# Patient Record
Sex: Female | Born: 1975 | Race: White | Hispanic: No | Marital: Married | Smoking: Never smoker
Health system: Southern US, Community
[De-identification: ages and names within clinical notes are randomized; demographics above are authoritative.]

## PROBLEM LIST (undated history)

## (undated) DIAGNOSIS — R112 Nausea with vomiting, unspecified: Secondary | ICD-10-CM

## (undated) DIAGNOSIS — Z9889 Other specified postprocedural states: Secondary | ICD-10-CM

## (undated) DIAGNOSIS — K589 Irritable bowel syndrome without diarrhea: Secondary | ICD-10-CM

## (undated) DIAGNOSIS — K219 Gastro-esophageal reflux disease without esophagitis: Secondary | ICD-10-CM

## (undated) HISTORY — DX: Irritable bowel syndrome, unspecified: K58.9

---

## 2006-06-16 ENCOUNTER — Ambulatory Visit: Payer: Self-pay | Admitting: Family Medicine

## 2006-06-25 ENCOUNTER — Encounter: Admission: RE | Admit: 2006-06-25 | Discharge: 2006-06-25 | Payer: Self-pay | Admitting: Family Medicine

## 2006-07-07 ENCOUNTER — Ambulatory Visit: Payer: Self-pay | Admitting: Family Medicine

## 2006-07-07 ENCOUNTER — Ambulatory Visit: Payer: Self-pay | Admitting: *Deleted

## 2006-07-20 HISTORY — PX: WISDOM TOOTH EXTRACTION: SHX21

## 2007-11-20 ENCOUNTER — Encounter (INDEPENDENT_AMBULATORY_CARE_PROVIDER_SITE_OTHER): Payer: Self-pay | Admitting: Obstetrics and Gynecology

## 2007-11-20 ENCOUNTER — Inpatient Hospital Stay (HOSPITAL_COMMUNITY): Admission: AD | Admit: 2007-11-20 | Discharge: 2007-11-25 | Payer: Self-pay | Admitting: Obstetrics and Gynecology

## 2007-12-14 ENCOUNTER — Ambulatory Visit: Admission: RE | Admit: 2007-12-14 | Discharge: 2007-12-14 | Payer: Self-pay | Admitting: Obstetrics and Gynecology

## 2008-03-24 ENCOUNTER — Encounter: Payer: Self-pay | Admitting: Family Medicine

## 2008-04-11 ENCOUNTER — Encounter: Payer: Self-pay | Admitting: Family Medicine

## 2008-04-17 ENCOUNTER — Telehealth (INDEPENDENT_AMBULATORY_CARE_PROVIDER_SITE_OTHER): Payer: Self-pay | Admitting: *Deleted

## 2008-04-17 ENCOUNTER — Encounter: Payer: Self-pay | Admitting: Family Medicine

## 2008-04-17 DIAGNOSIS — O1410 Severe pre-eclampsia, unspecified trimester: Secondary | ICD-10-CM | POA: Insufficient documentation

## 2008-04-17 LAB — CONVERTED CEMR LAB
AST: 26 units/L (ref 0–37)
BUN: 7 mg/dL (ref 6–23)
Basophils Relative: 0.5 % (ref 0.0–1.0)
Creatinine, Ser: 0.9 mg/dL (ref 0.4–1.2)
Eosinophil percent: 1.8 % (ref 0.0–5.0)
GFR calc non Af Amer: 80 mL/min
Glomerular Filtration Rate, Af Am: 97 mL/min/{1.73_m2}
H Pylori IgG: NEGATIVE
MCHC: 33.6 g/dL (ref 30.0–36.0)
MCV: 97 fL (ref 78.0–100.0)
Monocytes Absolute: 0.3 10*3/uL (ref 0.2–0.7)
Neutro Abs: 2.8 10*3/uL (ref 1.4–7.7)
Potassium: 4 meq/L (ref 3.5–5.1)
RDW: 11.4 % — ABNORMAL LOW (ref 11.5–14.6)
Sodium: 140 meq/L (ref 135–145)
T3, Free: 3.3 pg/mL (ref 2.3–4.2)

## 2009-03-14 ENCOUNTER — Ambulatory Visit: Payer: Self-pay | Admitting: Family Medicine

## 2009-03-14 DIAGNOSIS — M79609 Pain in unspecified limb: Secondary | ICD-10-CM

## 2009-03-14 DIAGNOSIS — F329 Major depressive disorder, single episode, unspecified: Secondary | ICD-10-CM

## 2009-05-15 ENCOUNTER — Ambulatory Visit: Payer: Self-pay | Admitting: Family Medicine

## 2010-12-02 NOTE — Op Note (Signed)
[name], [name]            ACCOUNT NO.:  [id number]   MEDICAL RECORD NO.:  [id number]          PATIENT TYPE:  INP   LOCATION:  9103                          FACILITY:  [facility]   PHYSICIAN:  Guy Sandifer. Henderson Cloud, M.D. DATE OF BIRTH:  1976-04-22   DATE OF PROCEDURE:  11/20/2007  DATE OF DISCHARGE:                               OPERATIVE REPORT   PREOPERATIVE DIAGNOSES:  1. Intrauterine pregnancy at 40-1/7th weeks' estimated gestational      age.  2. Nonreassuring fetal heart tracing.  3. Possible placental abruption.   POSTOPERATIVE DIAGNOSES:  1. Intrauterine pregnancy at 40-1/7th weeks' estimated gestational      age.  2. Nonreassuring fetal heart tracing.  3. Possible placental abruption.   PROCEDURE:  Low-transverse cesarean section.   SURGEON:  Guy Sandifer. Henderson Cloud, MD   ANESTHESIA:  Epidural by Burnett Corrente, MD   SPECIMENS:  Placenta to pathology.   ESTIMATED BLOOD LOSS:  800 mL.   FINDINGS:  Viable female infant, Apgars of 8 at nine and 1 at five  minutes respectively.  Birth weight pending.  Arterial cord pH 7.17.   INDICATIONS AND CONSENT:  This patient is a 35 year old married white  female G1, P0 with an EDC of Nov 19, 2007.  She presents with uterine  contractions.  Cervix is 2 cm dilated, 90% effaced, -1 to -2 station  with regular firm contractions.  Fetal heart rate is unreactive.  Artificial rupture of membranes with clear fluid is carried out.  She  subsequently has an epidural placed.  An intrauterine pressure catheter  was placed and subsequent to that Pitocin was started.  She had adequate  labor.  Cervix progresses to 4-5 cm.  Vertex is now -3 with caput  formation and a puffy cervix.  There is a moderate amount of bleeding  that appears too heavy to be consistent with bloody show and could  possibly represent placental abruption.  Uterus is soft between  contractions.  In addition, there are decelerations to the 70s with each  contraction, which are  getting deeper.  Due to relative lack of progress  with good labor, an anticipated length of labor with decelerations, and  now the bleeding, recommendation for cesarean section was made.  Potential risks and complications were discussed including, but not  limited to infection, organ damage, bleeding requiring transfusion of  blood products with possible HIV and hepatitis acquisition, DVT, PE, and  pneumonia.  All questions were answered and consent is signed on the  chart.   PROCEDURE:  The patient was taken to operating room where epidural was  augmented to a surgical level.  She is prepped and draped in a sterile  fashion.  Foley catheter is already in place.  After testing for  adequate epidural anesthesia, skin is entered through a Pfannenstiel  incision and dissection was carried out at the layers of the peritoneum.  Peritoneum was incised, extended superiorly and inferiorly.  Vesicouterine peritoneum was taken down cephalad laterally.  Bladder  flap was developed and bladder blade is placed.  Uterus was incised in a  low transverse manner and the  uterine cavity is entered bluntly with a  hemostat.  The uterine incision is extended cephalad laterally with  fingers.  Baby is noted to be a LOT.  Vertex is then delivered.  There  is a nuchal cord x1 that has reduced.  Oronasal pharynx were suctioned.  Remainder of the baby was delivered.  Good cry and tone is noted.  Cord  is clamped and cut.  The baby was handed to the awaiting pediatrics  team.  Placenta was manually delivered.  Uterine cavity is clean.  Uterus is closed in two running locking imbricating layers with 0  Monocryl suture, which achieves good hemostasis.  Tubes and ovaries were  normal bilaterally.  Anterior peritoneum was closed in running fashion  with 0 Monocryl suture which was also used to reapproximate the  pyramidalis muscle in the midline.  Anterior rectus fascia was closed in  running fashion with 0 PDS  suture and the skin was closed with clips.  All sponge, instrument, and needle counts were correct and the patient  was transferred to recovery room in stable condition.      Guy Sandifer Henderson Cloud, M.D.  Electronically Signed     JET/MEDQ  D:  11/20/2007  T:  11/21/2007  Job:  [id number]

## 2010-12-02 NOTE — Discharge Summary (Signed)
[name], [name]            ACCOUNT NO.:  [id number]   MEDICAL RECORD NO.:  [id number]          PATIENT TYPE:  INP   LOCATION:  9371                          FACILITY:  [facility]   PHYSICIAN:  Michelle L. Grewal, M.D.DATE OF BIRTH:  [birth year]   DATE OF ADMISSION:  11/20/2007  DATE OF DISCHARGE:  11/25/2007                               DISCHARGE SUMMARY   ADMITTING DIAGNOSIS:  1. Intrauterine pregnancy at 103 and 7 weeks estimated gestational age.  2. Spontaneous onset of labor.   DISCHARGE DIAGNOSIS:  1. Status post low transverse cesarean section secondary to      nonreassuring fetal heart tones with positive placental abruption.  2. HELLP syndrome.  3. Viable female infant.   PROCEDURE:  Primary low transverse cesarean section.   REASON FOR ADMISSION:  Please see written H&P.   HOSPITAL COURSE:  The patient is a 31-year white married female  primigravida who was admitted to [facility] with  spontaneous onset of labor.  On admission, vital signs were stable.  Cervix was dilated to 2 cm, 90% effaced, vertex at -1 to -2 station.  Fetal heart tones were reactive.  Artificial rupture of membranes was  performed which revealed clear fluid.  The patient did have epidural  placed for her comfort.  Intrauterine pressure catheter was placed.  Subsequent to that, Pitocin was started.  The patient did progress to 4-  5 cm vertex with now in a -3 station with caput formation and a puffy  cervix.  There was a moderate amount of bleeding that was noted too  heavy to be consistent with bloody show and possible could be placental  abruption.  Uterus was soft, however, between contractions.  The fetal  heart tones began to be somewhat nonreactive with deceleration into the  70s with the contractions and due to the relative lack of progress with  anticipated length of labor, vaginal bleeding, decision was made to  recommend cesarean delivery.  The patient was then taken to  the  operating room where epidural was dosed to an adequate surgical level.  A low transverse incision was made with delivery of a viable female  infant weighing 7 pounds 5 ounces with Apgars of 8 at 1 minute and 8 at  5 minutes.  Arterial cord pH was 7.17.  The patient tolerated the  procedure well and was taken to the recovery room in stable condition.  On the following morning, the patient was without complaint other than  itching.  Vital signs were stable.  She was afebrile.  Blood pressure  was 100/64 to 195/57, and pulse was 62-71.  Abdomen was soft, good  return of bowel function.  Fundus was firm and nontender.  Abdominal  dressing was noted to be clean, dry, and intact.  Foley had been  discontinued and the patient was voiding well.  Laboratory findings  showed hemoglobin of 9.5, platelet count of 37,000, WBC count of 13.8,  platelets had been 115 on admission and that was noted to be 176 on her  prenatal lab work.  Repeat CBC was ordered at noon, and the patient  was  discontinued on Motrin.  On the repeat CBC, platelets were 43,000.  On  the following morning, the patient denied headache, but she did have  some blurred vision and some shakes and felt week.  She denied any right  upper quadrant pain.  Vital signs were stable with blood pressure 92/57  to 108/60.  Abdomen was soft.  Fundus firm and nontender.  Some small  clotting was noted, but moderate lochia.  Abdominal dressing was intact.  Incision was clean, dry, and intact.  There was some small ecchymosis  noted inferior to the incisional site.  Laboratory findings revealed  hemoglobin of 9.2 and platelet count up to 45000, WBC count of 14.1.  PIH labs were ordered for 12 noon.  Upon the laboratory findings, PIH  labs revealed platelets were up to 60,000, but SGOT was 95 and SGPT was  184 and LDH was 363.  Hemoglobin was stable at 10.2.  Blood pressures  remained 90-100/50-60.  Deep tendon reflexes 2+.  The patient was  now  transferred to the [facility] where she was started on magnesium sulfate,  given that she was clinically asymptomatic.  On the following morning,  the patient was feeling better.  Blood pressure was 120/82.  She  remained afebrile.  Platelets were up to 80,000, hemoglobin 9.7, SGOT  was 57, and SGPT was 130.  The patient continued on the magnesium  sulfate for 24 hours.  On the following morning, vital signs were  stable.  Abdomen was soft.  Laboratory findings revealed hemoglobin of  9.8 and platelet count up to 128,000.  AST was 57 and ALT was 114.  On  the following morning, the patient stated that she was without  complaint.  She felt more like her self.  Abdomen was soft.  Incision  was clean, dry, and intact.  Laboratory findings revealed hemoglobin of  10.1 and platelet count of 172,000.  Discharge instructions were  reviewed and the patient was later discharged home.   CONDITION ON DISCHARGE:  Stable.   DIET:  Regular as tolerated.   ACTIVITY:  No heavy lifting, no driving x2 weeks, no vaginal entry.   FOLLOW UP:  The patient is to follow up in the office in 2-3 days for  laboratory evaluation and blood pressure check.  She is to call for  temperature greater than 100 degrees, persistent nausea, vomiting, or  heavy vaginal bleeding, and/or redness or drainage from incisional site.  The patient was also instructed to call for headache, blurred vision, or  right upper quadrant pain.   DISCHARGE MEDICATIONS:  1. Tylox #30 one p.o. 4-6 h. p.r.n.  2. Motrin 600 mg every 6 hours.  3. Prenatal vitamins 1 p.o. daily.  4. Colace 1 p.o. daily.      Julio Sicks, N.P.      Stann Mainland. Vincente Poli, M.D.  Electronically Signed    CC/MEDQ  D:  12/12/2007  T:  12/12/2007  Job:  [id number]

## 2011-11-07 ENCOUNTER — Emergency Department (HOSPITAL_COMMUNITY): Payer: No Typology Code available for payment source

## 2011-11-07 ENCOUNTER — Encounter (HOSPITAL_COMMUNITY): Payer: Self-pay | Admitting: *Deleted

## 2011-11-07 ENCOUNTER — Emergency Department (HOSPITAL_COMMUNITY)
Admission: EM | Admit: 2011-11-07 | Discharge: 2011-11-07 | Disposition: A | Discharge status: Home / Self Care | Payer: No Typology Code available for payment source | Attending: Emergency Medicine | Admitting: Emergency Medicine

## 2011-11-07 DIAGNOSIS — IMO0002 Reserved for concepts with insufficient information to code with codable children: Secondary | ICD-10-CM

## 2011-11-07 DIAGNOSIS — Y9229 Other specified public building as the place of occurrence of the external cause: Secondary | ICD-10-CM | POA: Insufficient documentation

## 2011-11-07 DIAGNOSIS — S91109A Unspecified open wound of unspecified toe(s) without damage to nail, initial encounter: Secondary | ICD-10-CM | POA: Insufficient documentation

## 2011-11-07 DIAGNOSIS — W268XXA Contact with other sharp object(s), not elsewhere classified, initial encounter: Secondary | ICD-10-CM | POA: Insufficient documentation

## 2011-11-07 MED ORDER — LIDOCAINE HCL 1 % IJ SOLN
5.0000 mL | Freq: Once | INTRAMUSCULAR | Status: DC
  Filled 2011-11-07: qty 20

## 2011-11-07 MED ORDER — HYDROCODONE-ACETAMINOPHEN 5-325 MG PO TABS: 1.0000 | ORAL_TABLET | ORAL | Status: AC | PRN

## 2011-11-07 NOTE — ED Notes (Signed)
Tiffany, PA at bedside for sutures.

## 2011-11-07 NOTE — ED Notes (Signed)
Lidocaine and suture cart at bedside.  

## 2011-11-07 NOTE — ED Notes (Signed)
Pt states she was shopping at [employer] 45 minutes ago, glass bottle fell on her left foot. Staff cleaned wound and wrapped foot

## 2011-11-07 NOTE — ED Provider Notes (Signed)
History     CSN: [id number]  Arrival date & time 11/07/11  1649   First MD Initiated Contact with Patient 11/07/11 1719      Chief Complaint  Patient presents with  . Laceration    (Consider location/radiation/quality/duration/timing/severity/associated sxs/prior treatment) HPI  Pt at [employer] when glass bottle fell off of shelf and on to her foot. The patient states that she received a laceration to the toe on her left foot and that it is throbbing. This happened just prior to arrival. The severity is moderation. She denies syncope, weakness or injury to any other part of her body durign this incident.   History reviewed. No pertinent past medical history.  Past Surgical History  Procedure Date  . Cesarean section 2009    History reviewed. No pertinent family history.  History  Substance Use Topics  . Smoking status: Never Smoker   . Smokeless tobacco: Not on file  . Alcohol Use: No    OB History    Grav Para Term Preterm Abortions TAB SAB Ect Mult Living                  Review of Systems   HEENT: denies blurry vision or change in hearing PULMONARY: Denies difficulty breathing and SOB CARDIAC: denies chest pain or heart palpitations MUSCULOSKELETAL: pt is ambulatory, denies being unable to ambulate ABDOMEN AL: denies abdominal pain GU: denies loss of bowel urinary control NEURO: denies numbness and tingling in extremities   Allergies  Review of patient's allergies indicates no known allergies.  Home Medications   Current Outpatient Rx  Name Route Sig Dispense Refill  . HYDROCODONE-ACETAMINOPHEN 5-325 MG PO TABS Oral Take 1 tablet by mouth every 4 (four) hours as needed for pain. 6 tablet 0    BP 111/64  Pulse 60  Temp(Src) 98.2 F (36.8 C) (Oral)  Resp 16  Ht 5\' 2"  (1.575 m)  Wt 120 lb (54.432 kg)  BMI 21.95 kg/m2  SpO2 100%  LMP 11/02/2011  Physical Exam  ED Course  Procedures (including critical care time)  Labs Reviewed - No data  to display Dg Foot Complete Left  11/07/2011  *RADIOLOGY REPORT*  Clinical Data: Glass bottle fell on foot, laceration to third toe  LEFT FOOT - COMPLETE 3+ VIEW  Comparison: None.  Findings: No fracture or dislocation is seen.  The joint spaces are preserved.  Visualized soft tissues are grossly unremarkable.  No radiopaque foreign body is seen.  IMPRESSION: No fracture, dislocation, or radiopaque foreign body is seen.  Original Report Authenticated By: Charline Bills, M.D.     1. Laceration       MDM  LACERATION REPAIR Performed by: Dorthula Matas Authorized by: Dorthula Matas Consent: Verbal consent obtained. Risks and benefits: risks, benefits and alternatives were discussed Consent given by: patient Patient identity confirmed: provided demographic data Prepped and Draped in normal sterile fashion Wound explored  Laceration Location: 4th toe of left foot  Laceration Length: 1.25 cm  No Foreign Bodies seen or palpated  Anesthesia: local infiltration  Local anesthetic: lidocaine 2% wo epinephrine  Anesthetic total: 2 ml  Irrigation method: syringe Amount of cleaning: standard  Skin closure: sutures  Number of sutures: 3  Technique: simple interrupted  Patient tolerance: Patient tolerated the procedure well with no immediate complications. \  Pt given 6 Vicodin for pain. xrays negative. No red flags noted on exam.  Pt has been advised of the symptoms that warrant their return to the ED. Patient  has voiced understanding and has agreed to follow-up with the PCP or specialist.       Dorthula Matas, PA 11/07/11 1821

## 2011-11-07 NOTE — Discharge Instructions (Signed)

## 2011-11-08 NOTE — ED Provider Notes (Signed)
Medical screening examination/treatment/procedure(s) were performed by non-physician practitioner and as supervising physician I was immediately available for consultation/collaboration.  Toy Baker, MD 11/08/11 2329

## 2011-11-14 ENCOUNTER — Ambulatory Visit (INDEPENDENT_AMBULATORY_CARE_PROVIDER_SITE_OTHER): Payer: BC Managed Care – PPO | Admitting: Family Medicine

## 2011-11-14 VITALS — BP 96/61 | HR 68 | Temp 98.2°F | Resp 16 | Ht 61.5 in | Wt 114.4 lb

## 2011-11-14 DIAGNOSIS — Z4802 Encounter for removal of sutures: Secondary | ICD-10-CM

## 2011-11-14 NOTE — Progress Notes (Signed)
  Subjective:    Patient ID: [name], female    DOB: 01-19-1976, 36 y.o.   MRN: [id number]  HPI  Patient presents for suture removal. Laceration 4/20 after glass bottle fell on (L) foot. Denies S/S of secondary infection.  Foot occasionally achy.  Unknown date of last Tdap.  Review of Systems     Objective:   Physical Exam  Constitutional: She appears well-developed.  HENT:  Head: Normocephalic.  Neurological: She is alert.  Skin:       Well healed laceration to (L) 3rd toe. (3) interrupted sutures removed without diificulty  Psychiatric: She has a normal mood and affect.          Assessment & Plan:   1. Visit for suture removal                                 Patient to see GYN to update Tdap . Anticipatory guidance

## 2012-01-25 ENCOUNTER — Other Ambulatory Visit: Payer: Self-pay

## 2012-01-25 LAB — OB RESULTS CONSOLE ABO/RH: RH Type: POSITIVE

## 2012-01-25 LAB — OB RESULTS CONSOLE HEPATITIS B SURFACE ANTIGEN: Hepatitis B Surface Ag: NEGATIVE

## 2012-01-25 LAB — OB RESULTS CONSOLE GC/CHLAMYDIA: Gonorrhea: NEGATIVE

## 2012-04-05 ENCOUNTER — Other Ambulatory Visit (HOSPITAL_COMMUNITY): Payer: Self-pay | Admitting: Obstetrics and Gynecology

## 2012-04-05 DIAGNOSIS — IMO0002 Reserved for concepts with insufficient information to code with codable children: Secondary | ICD-10-CM

## 2012-04-07 ENCOUNTER — Encounter (HOSPITAL_COMMUNITY): Payer: Self-pay

## 2012-04-07 ENCOUNTER — Ambulatory Visit (HOSPITAL_COMMUNITY)
Admission: RE | Admit: 2012-04-07 | Discharge: 2012-04-07 | Disposition: A | Discharge status: Home / Self Care | Payer: BC Managed Care – PPO | Source: Ambulatory Visit | Attending: Obstetrics and Gynecology | Admitting: Obstetrics and Gynecology

## 2012-04-07 VITALS — BP 108/61 | HR 79 | Wt 126.8 lb

## 2012-04-07 DIAGNOSIS — Z363 Encounter for antenatal screening for malformations: Secondary | ICD-10-CM | POA: Insufficient documentation

## 2012-04-07 DIAGNOSIS — Z1389 Encounter for screening for other disorder: Secondary | ICD-10-CM | POA: Insufficient documentation

## 2012-04-07 DIAGNOSIS — O358XX0 Maternal care for other (suspected) fetal abnormality and damage, not applicable or unspecified: Secondary | ICD-10-CM | POA: Insufficient documentation

## 2012-04-07 DIAGNOSIS — O09299 Supervision of pregnancy with other poor reproductive or obstetric history, unspecified trimester: Secondary | ICD-10-CM | POA: Insufficient documentation

## 2012-04-07 DIAGNOSIS — IMO0002 Reserved for concepts with insufficient information to code with codable children: Secondary | ICD-10-CM

## 2012-04-07 DIAGNOSIS — O34219 Maternal care for unspecified type scar from previous cesarean delivery: Secondary | ICD-10-CM | POA: Insufficient documentation

## 2012-05-19 ENCOUNTER — Encounter (HOSPITAL_COMMUNITY): Payer: Self-pay

## 2012-05-19 ENCOUNTER — Ambulatory Visit (HOSPITAL_COMMUNITY)
Admission: RE | Admit: 2012-05-19 | Discharge: 2012-05-19 | Disposition: A | Discharge status: Home / Self Care | Payer: BC Managed Care – PPO | Source: Ambulatory Visit | Attending: Obstetrics and Gynecology | Admitting: Obstetrics and Gynecology

## 2012-05-19 VITALS — BP 99/59 | HR 86 | Wt 138.0 lb

## 2012-05-19 DIAGNOSIS — O34219 Maternal care for unspecified type scar from previous cesarean delivery: Secondary | ICD-10-CM | POA: Insufficient documentation

## 2012-05-19 DIAGNOSIS — O4400 Placenta previa specified as without hemorrhage, unspecified trimester: Secondary | ICD-10-CM | POA: Insufficient documentation

## 2012-05-19 DIAGNOSIS — O09299 Supervision of pregnancy with other poor reproductive or obstetric history, unspecified trimester: Secondary | ICD-10-CM | POA: Insufficient documentation

## 2012-05-19 DIAGNOSIS — O358XX0 Maternal care for other (suspected) fetal abnormality and damage, not applicable or unspecified: Secondary | ICD-10-CM | POA: Insufficient documentation

## 2012-05-19 DIAGNOSIS — IMO0002 Reserved for concepts with insufficient information to code with codable children: Secondary | ICD-10-CM

## 2012-07-22 ENCOUNTER — Ambulatory Visit (HOSPITAL_COMMUNITY)
Admission: RE | Admit: 2012-07-22 | Discharge: 2012-07-22 | Disposition: A | Discharge status: Home / Self Care | Payer: BC Managed Care – PPO | Source: Ambulatory Visit | Attending: Obstetrics and Gynecology | Admitting: Obstetrics and Gynecology

## 2012-07-22 VITALS — BP 107/58 | HR 89 | Wt 147.5 lb

## 2012-07-22 DIAGNOSIS — O358XX0 Maternal care for other (suspected) fetal abnormality and damage, not applicable or unspecified: Secondary | ICD-10-CM | POA: Insufficient documentation

## 2012-07-22 DIAGNOSIS — O34219 Maternal care for unspecified type scar from previous cesarean delivery: Secondary | ICD-10-CM | POA: Insufficient documentation

## 2012-07-22 DIAGNOSIS — O09299 Supervision of pregnancy with other poor reproductive or obstetric history, unspecified trimester: Secondary | ICD-10-CM | POA: Insufficient documentation

## 2012-07-22 DIAGNOSIS — O4400 Placenta previa specified as without hemorrhage, unspecified trimester: Secondary | ICD-10-CM | POA: Insufficient documentation

## 2012-07-22 DIAGNOSIS — IMO0002 Reserved for concepts with insufficient information to code with codable children: Secondary | ICD-10-CM

## 2012-07-22 NOTE — Progress Notes (Signed)
[name]  was seen today for an ultrasound appointment.  See full report in [facility].  Impression: IUP at 33 + 5 weeks Unilateral renal agenesis All other interval fetal anatomy was seen and appeared normal   Normal amniotic fluid volume  Appropriate interval growth with EFW at the 48th %tile    Recommendations: Recommend follow up ultrasound in 3 weeks to reevaluate growth and amniotic fluid.  Alpha Gula, MD

## 2012-08-12 ENCOUNTER — Ambulatory Visit (HOSPITAL_COMMUNITY)
Admission: RE | Admit: 2012-08-12 | Discharge: 2012-08-12 | Disposition: A | Discharge status: Home / Self Care | Payer: BC Managed Care – PPO | Source: Ambulatory Visit | Attending: Obstetrics and Gynecology | Admitting: Obstetrics and Gynecology

## 2012-08-12 DIAGNOSIS — O34219 Maternal care for unspecified type scar from previous cesarean delivery: Secondary | ICD-10-CM | POA: Insufficient documentation

## 2012-08-12 DIAGNOSIS — O09299 Supervision of pregnancy with other poor reproductive or obstetric history, unspecified trimester: Secondary | ICD-10-CM | POA: Insufficient documentation

## 2012-08-12 DIAGNOSIS — O4400 Placenta previa specified as without hemorrhage, unspecified trimester: Secondary | ICD-10-CM | POA: Insufficient documentation

## 2012-08-12 DIAGNOSIS — O358XX0 Maternal care for other (suspected) fetal abnormality and damage, not applicable or unspecified: Secondary | ICD-10-CM | POA: Insufficient documentation

## 2012-08-12 DIAGNOSIS — IMO0002 Reserved for concepts with insufficient information to code with codable children: Secondary | ICD-10-CM

## 2012-08-17 ENCOUNTER — Encounter (HOSPITAL_COMMUNITY): Payer: Self-pay | Admitting: Pharmacist

## 2012-08-22 ENCOUNTER — Encounter (HOSPITAL_COMMUNITY): Payer: Self-pay

## 2012-08-23 ENCOUNTER — Encounter (HOSPITAL_COMMUNITY)
Admission: RE | Admit: 2012-08-23 | Discharge: 2012-08-23 | Disposition: A | Discharge status: Home / Self Care | Payer: BC Managed Care – PPO | Source: Ambulatory Visit | Attending: Obstetrics and Gynecology | Admitting: Obstetrics and Gynecology

## 2012-08-23 ENCOUNTER — Encounter (HOSPITAL_COMMUNITY): Payer: Self-pay

## 2012-08-23 HISTORY — DX: Nausea with vomiting, unspecified: Z98.890

## 2012-08-23 HISTORY — DX: Gastro-esophageal reflux disease without esophagitis: K21.9

## 2012-08-23 HISTORY — DX: Nausea with vomiting, unspecified: R11.2

## 2012-08-23 LAB — CBC
MCHC: 34 g/dL (ref 30.0–36.0)
MCV: 97.9 fL (ref 78.0–100.0)
Platelets: 128 10*3/uL — ABNORMAL LOW (ref 150–400)
RDW: 13.5 % (ref 11.5–15.5)

## 2012-08-23 LAB — TYPE AND SCREEN

## 2012-08-23 LAB — RPR: RPR Ser Ql: NONREACTIVE

## 2012-08-23 NOTE — Patient Instructions (Signed)
   Your procedure is scheduled WU:[id number] February 10th  Enter through the Main Entrance of [facility] at:6am Pick up the phone at the desk and dial [phone] and inform us of your arrival.  Please call this number if you have any problems the morning of surgery: [phone]  Remember: Do not eat or drink anything after midnight You may take your zantac with sips of water   Do not wear jewelry, make-up, or FINGER nail polish No metal in your hair or on your body. Do not wear lotions, powders, perfumes. You may wear deodorant.  Please use your CHG wash as directed prior to surgery.  Do not shave anywhere for at least 12 hours prior to first CHG shower.  Do not bring valuables to the hospital.   Leave suitcase in the car. After Surgery it may be brought to your room. For patients being admitted to the hospital, checkout time is 11:00am the day of discharge.

## 2012-08-23 NOTE — Pre-Procedure Instructions (Signed)
Platelets 128-last check January 25, 2012 in Prenatal Record-197-Dr Sherron Ales notified-no intervention. Low PPH Risk

## 2012-08-26 NOTE — H&P (Signed)
[name] is a 37 y.o. female presenting for repeat cesarean section at 39 weeks.  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 Maternal Medical History:  Fetal activity: Perceived fetal activity is normal.    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    OB History    Grav Para Term Preterm Abortions TAB SAB Ect Mult Living   2 1 1  0 0 0 0 0 0 1     Past Medical History  Diagnosis Date  . PONV (postoperative nausea and vomiting)   . GERD (gastroesophageal reflux disease)    Past Surgical History  Procedure Date  . Cesarean section 2009  . Wisdom tooth extraction 2008   Family History: family history is not on file. Social History:  reports that she has never smoked. She does not have any smokeless tobacco history on file. She reports that she does not drink alcohol or use illicit drugs.   Prenatal Transfer Tool  Maternal Diabetes: No Genetic Screening: Normal Maternal Ultrasounds/Referrals: Abnormal:  Findings:   Fetal Kidney Anomalies Fetal Ultrasounds or other Referrals:  Referred to Materal Fetal Medicine  Maternal Substance Abuse:  No Significant Maternal Medications:  None Significant Maternal Lab Results:  None Other Comments:  None  ROS    Last menstrual period 11/29/2011. Maternal Exam:  Abdomen: Surgical scars: low transverse.   Fetal presentation: vertex     Physical Exam  Constitutional: She is oriented to person, place, and time. She appears well-developed and well-nourished.  HENT:  Head: Normocephalic.  Eyes: Conjunctivae normal and EOM are normal. Pupils are equal, round, and reactive to light.  Cardiovascular: Normal rate, regular rhythm and normal heart sounds.   Respiratory: Effort normal and breath sounds normal.  GI:       Gravid uterus. Low transverse scar  Musculoskeletal: She exhibits edema.  Neurological: She is alert and oriented to  person, place, and time. She has normal reflexes.    Prenatal labs: ABO, Rh: --/--/B POS, B POS (02/04 1010) Antibody: NEG (02/04 1010) Rubella:   RPR: NON REACTIVE (02/04 1010)  HBsAg: Negative (07/08 0000)  HIV: Non-reactive (07/08 0000)  GBS:     Assessment/Plan:intrauterine pregnancy at 39 weeks for repeat cesarean section.  Right renal agenesis of fetus Risk of cesarean section discussed.  These include:  Risk of infection;  Risk of hemorrhage that could require transfusions with the associated risk of aids or hepatitis;  Excessive bleeding could require hysterectomy;  Risk of injury to adjacent organs including bladder, bowel or ureters;  Risk of DVT's and possible pulmonary embolus.  Patient expresses a understanding of indications and risks.;    Alex Mcmanigal S 08/26/2012, 10:07 AM

## 2012-08-27 ENCOUNTER — Encounter (HOSPITAL_COMMUNITY): Payer: Self-pay | Admitting: Anesthesiology

## 2012-08-27 ENCOUNTER — Inpatient Hospital Stay (HOSPITAL_COMMUNITY)
Admission: AD | Admit: 2012-08-27 | Discharge: 2012-08-29 | DRG: 371 | Disposition: A | Discharge status: Home / Self Care | Payer: BC Managed Care – PPO | Source: Ambulatory Visit | Attending: Obstetrics and Gynecology | Admitting: Obstetrics and Gynecology

## 2012-08-27 ENCOUNTER — Inpatient Hospital Stay (HOSPITAL_COMMUNITY): Payer: BC Managed Care – PPO | Admitting: Anesthesiology

## 2012-08-27 ENCOUNTER — Encounter (HOSPITAL_COMMUNITY): Payer: Self-pay

## 2012-08-27 ENCOUNTER — Encounter (HOSPITAL_COMMUNITY)
Admission: AD | Disposition: A | Discharge status: Home / Self Care | Payer: Self-pay | Source: Ambulatory Visit | Attending: Obstetrics and Gynecology

## 2012-08-27 DIAGNOSIS — O358XX0 Maternal care for other (suspected) fetal abnormality and damage, not applicable or unspecified: Secondary | ICD-10-CM | POA: Diagnosis present

## 2012-08-27 DIAGNOSIS — M79609 Pain in unspecified limb: Secondary | ICD-10-CM

## 2012-08-27 DIAGNOSIS — O1410 Severe pre-eclampsia, unspecified trimester: Secondary | ICD-10-CM

## 2012-08-27 DIAGNOSIS — F329 Major depressive disorder, single episode, unspecified: Secondary | ICD-10-CM

## 2012-08-27 DIAGNOSIS — O34219 Maternal care for unspecified type scar from previous cesarean delivery: Principal | ICD-10-CM | POA: Diagnosis present

## 2012-08-27 LAB — CBC
MCH: 33.2 pg (ref 26.0–34.0)
MCHC: 34.3 g/dL (ref 30.0–36.0)
MCV: 97 fL (ref 78.0–100.0)
Platelets: 115 10*3/uL — ABNORMAL LOW (ref 150–400)
RBC: 3.7 MIL/uL — ABNORMAL LOW (ref 3.87–5.11)
RDW: 13.3 % (ref 11.5–15.5)

## 2012-08-27 LAB — TYPE AND SCREEN: ABO/RH(D): B POS

## 2012-08-27 SURGERY — Surgical Case
Anesthesia: Spinal | Site: Abdomen | Wound class: Clean Contaminated

## 2012-08-27 MED ORDER — MORPHINE SULFATE 0.5 MG/ML IJ SOLN
INTRAMUSCULAR | Status: AC
  Filled 2012-08-27: qty 10

## 2012-08-27 MED ORDER — ONDANSETRON HCL 4 MG/2ML IJ SOLN
INTRAMUSCULAR | Status: DC | PRN
  Administered 2012-08-27: 4 mg via INTRAVENOUS

## 2012-08-27 MED ORDER — SIMETHICONE 80 MG PO CHEW
80.0000 mg | CHEWABLE_TABLET | Freq: Three times a day (TID) | ORAL | Status: DC
  Administered 2012-08-27 – 2012-08-29 (×5): 80 mg via ORAL

## 2012-08-27 MED ORDER — NALBUPHINE HCL 10 MG/ML IJ SOLN
5.0000 mg | INTRAMUSCULAR | Status: DC | PRN
  Administered 2012-08-27 (×2): 5 mg via INTRAVENOUS
  Filled 2012-08-27 (×2): qty 1

## 2012-08-27 MED ORDER — FENTANYL CITRATE 0.05 MG/ML IJ SOLN
INTRAMUSCULAR | Status: DC | PRN
  Administered 2012-08-27: 25 ug via INTRATHECAL

## 2012-08-27 MED ORDER — SCOPOLAMINE 1 MG/3DAYS TD PT72
MEDICATED_PATCH | TRANSDERMAL | Status: AC
  Administered 2012-08-27: 1.5 mg via TRANSDERMAL
  Filled 2012-08-27: qty 1

## 2012-08-27 MED ORDER — ACETAMINOPHEN 10 MG/ML IV SOLN: 1000.0000 mg | Freq: Four times a day (QID) | INTRAVENOUS | Status: AC | PRN

## 2012-08-27 MED ORDER — LANOLIN HYDROUS EX OINT: 1.0000 "application " | TOPICAL_OINTMENT | CUTANEOUS | Status: DC | PRN

## 2012-08-27 MED ORDER — ONDANSETRON HCL 4 MG PO TABS: 4.0000 mg | ORAL_TABLET | ORAL | Status: DC | PRN

## 2012-08-27 MED ORDER — MORPHINE SULFATE (PF) 0.5 MG/ML IJ SOLN
INTRAMUSCULAR | Status: DC | PRN
  Administered 2012-08-27: .1 mg via INTRATHECAL

## 2012-08-27 MED ORDER — PROPOFOL 10 MG/ML IV EMUL
INTRAVENOUS | Status: AC
  Filled 2012-08-27: qty 20

## 2012-08-27 MED ORDER — METOCLOPRAMIDE HCL 5 MG/ML IJ SOLN: 10.0000 mg | Freq: Three times a day (TID) | INTRAMUSCULAR | Status: DC | PRN

## 2012-08-27 MED ORDER — ONDANSETRON HCL 4 MG/2ML IJ SOLN
INTRAMUSCULAR | Status: AC
  Filled 2012-08-27: qty 2

## 2012-08-27 MED ORDER — OXYTOCIN 40 UNITS IN LACTATED RINGERS INFUSION - SIMPLE MED: 62.5000 mL/h | INTRAVENOUS | Status: AC

## 2012-08-27 MED ORDER — PHENYLEPHRINE HCL 10 MG/ML IJ SOLN
INTRAMUSCULAR | Status: DC | PRN
  Administered 2012-08-27 (×2): 80 ug via INTRAVENOUS

## 2012-08-27 MED ORDER — MEPERIDINE HCL 25 MG/ML IJ SOLN: 6.2500 mg | INTRAMUSCULAR | Status: DC | PRN

## 2012-08-27 MED ORDER — WITCH HAZEL-GLYCERIN EX PADS: 1.0000 "application " | MEDICATED_PAD | CUTANEOUS | Status: DC | PRN

## 2012-08-27 MED ORDER — LACTATED RINGERS IV SOLN
INTRAVENOUS | Status: DC
  Administered 2012-08-27 (×3): via INTRAVENOUS

## 2012-08-27 MED ORDER — PRENATAL MULTIVITAMIN CH
1.0000 | ORAL_TABLET | Freq: Every day | ORAL | Status: DC
  Administered 2012-08-28 – 2012-08-29 (×2): 1 via ORAL
  Filled 2012-08-27 (×2): qty 1

## 2012-08-27 MED ORDER — KETOROLAC TROMETHAMINE 30 MG/ML IJ SOLN: 30.0000 mg | Freq: Four times a day (QID) | INTRAMUSCULAR | Status: AC | PRN

## 2012-08-27 MED ORDER — PHENYLEPHRINE 40 MCG/ML (10ML) SYRINGE FOR IV PUSH (FOR BLOOD PRESSURE SUPPORT)
PREFILLED_SYRINGE | INTRAVENOUS | Status: AC
  Filled 2012-08-27: qty 5

## 2012-08-27 MED ORDER — FENTANYL CITRATE 0.05 MG/ML IJ SOLN: 25.0000 ug | INTRAMUSCULAR | Status: DC | PRN

## 2012-08-27 MED ORDER — OXYTOCIN 10 UNIT/ML IJ SOLN
INTRAMUSCULAR | Status: DC | PRN
  Administered 2012-08-27: 40 [IU]

## 2012-08-27 MED ORDER — KETOROLAC TROMETHAMINE 30 MG/ML IJ SOLN
30.0000 mg | Freq: Four times a day (QID) | INTRAMUSCULAR | Status: AC | PRN
  Administered 2012-08-27 (×2): 30 mg via INTRAVENOUS
  Filled 2012-08-27: qty 1

## 2012-08-27 MED ORDER — KETOROLAC TROMETHAMINE 30 MG/ML IJ SOLN
INTRAMUSCULAR | Status: AC
  Administered 2012-08-27: 30 mg via INTRAVENOUS
  Filled 2012-08-27: qty 1

## 2012-08-27 MED ORDER — SODIUM CHLORIDE 0.9 % IJ SOLN: 3.0000 mL | INTRAMUSCULAR | Status: DC | PRN

## 2012-08-27 MED ORDER — NALOXONE HCL 1 MG/ML IJ SOLN: 1.0000 ug/kg/h | INTRAVENOUS | Status: DC | PRN

## 2012-08-27 MED ORDER — MIDAZOLAM HCL 2 MG/2ML IJ SOLN: 0.5000 mg | Freq: Once | INTRAMUSCULAR | Status: DC | PRN

## 2012-08-27 MED ORDER — EPHEDRINE SULFATE 50 MG/ML IJ SOLN
INTRAMUSCULAR | Status: DC | PRN
  Administered 2012-08-27: 10 mg via INTRAVENOUS

## 2012-08-27 MED ORDER — ZOLPIDEM TARTRATE 5 MG PO TABS: 5.0000 mg | ORAL_TABLET | Freq: Every evening | ORAL | Status: DC | PRN

## 2012-08-27 MED ORDER — DIBUCAINE 1 % RE OINT: 1.0000 "application " | TOPICAL_OINTMENT | RECTAL | Status: DC | PRN

## 2012-08-27 MED ORDER — LACTATED RINGERS IV SOLN
INTRAVENOUS | Status: DC
  Administered 2012-08-27: 17:00:00 via INTRAVENOUS

## 2012-08-27 MED ORDER — DIPHENHYDRAMINE HCL 25 MG PO CAPS: 25.0000 mg | ORAL_CAPSULE | Freq: Four times a day (QID) | ORAL | Status: DC | PRN

## 2012-08-27 MED ORDER — SIMETHICONE 80 MG PO CHEW
80.0000 mg | CHEWABLE_TABLET | ORAL | Status: DC | PRN
  Administered 2012-08-28: 80 mg via ORAL

## 2012-08-27 MED ORDER — SCOPOLAMINE 1 MG/3DAYS TD PT72
1.0000 | MEDICATED_PATCH | Freq: Once | TRANSDERMAL | Status: DC
  Filled 2012-08-27: qty 1

## 2012-08-27 MED ORDER — SODIUM CHLORIDE 0.9 % IR SOLN
Status: DC | PRN
  Administered 2012-08-27: 1000 mL

## 2012-08-27 MED ORDER — TETANUS-DIPHTH-ACELL PERTUSSIS 5-2.5-18.5 LF-MCG/0.5 IM SUSP: 0.5000 mL | Freq: Once | INTRAMUSCULAR | Status: DC

## 2012-08-27 MED ORDER — ONDANSETRON HCL 4 MG/2ML IJ SOLN: 4.0000 mg | Freq: Three times a day (TID) | INTRAMUSCULAR | Status: DC | PRN

## 2012-08-27 MED ORDER — CEFAZOLIN SODIUM-DEXTROSE 2-3 GM-% IV SOLR
2.0000 g | Freq: Once | INTRAVENOUS | Status: AC
  Administered 2012-08-27: 2 g via INTRAVENOUS
  Filled 2012-08-27: qty 50

## 2012-08-27 MED ORDER — OXYTOCIN 10 UNIT/ML IJ SOLN
INTRAMUSCULAR | Status: AC
  Filled 2012-08-27: qty 4

## 2012-08-27 MED ORDER — DIPHENHYDRAMINE HCL 50 MG/ML IJ SOLN: 25.0000 mg | INTRAMUSCULAR | Status: DC | PRN

## 2012-08-27 MED ORDER — EPHEDRINE 5 MG/ML INJ
INTRAVENOUS | Status: AC
  Filled 2012-08-27: qty 10

## 2012-08-27 MED ORDER — NALOXONE HCL 0.4 MG/ML IJ SOLN: 0.4000 mg | INTRAMUSCULAR | Status: DC | PRN

## 2012-08-27 MED ORDER — OXYCODONE-ACETAMINOPHEN 5-325 MG PO TABS
1.0000 | ORAL_TABLET | ORAL | Status: DC | PRN
  Administered 2012-08-28: 1 via ORAL
  Administered 2012-08-28: 2 via ORAL
  Administered 2012-08-28: 1 via ORAL
  Administered 2012-08-28: 2 via ORAL
  Administered 2012-08-29: 1 via ORAL
  Filled 2012-08-27: qty 2
  Filled 2012-08-27 (×3): qty 1
  Filled 2012-08-27: qty 2

## 2012-08-27 MED ORDER — FAMOTIDINE IN NACL 20-0.9 MG/50ML-% IV SOLN
20.0000 mg | Freq: Once | INTRAVENOUS | Status: AC
  Administered 2012-08-27: 20 mg via INTRAVENOUS
  Filled 2012-08-27: qty 50

## 2012-08-27 MED ORDER — MENTHOL 3 MG MT LOZG: 1.0000 | LOZENGE | OROMUCOSAL | Status: DC | PRN

## 2012-08-27 MED ORDER — BUPIVACAINE HCL (PF) 0.75 % IJ SOLN
INTRAMUSCULAR | Status: DC | PRN
  Administered 2012-08-27: 1.5 mL

## 2012-08-27 MED ORDER — SENNOSIDES-DOCUSATE SODIUM 8.6-50 MG PO TABS
2.0000 | ORAL_TABLET | Freq: Every day | ORAL | Status: DC
  Administered 2012-08-27 – 2012-08-28 (×2): 2 via ORAL

## 2012-08-27 MED ORDER — ONDANSETRON HCL 4 MG/2ML IJ SOLN: 4.0000 mg | INTRAMUSCULAR | Status: DC | PRN

## 2012-08-27 MED ORDER — IBUPROFEN 600 MG PO TABS
600.0000 mg | ORAL_TABLET | Freq: Four times a day (QID) | ORAL | Status: DC
  Administered 2012-08-27 – 2012-08-29 (×7): 600 mg via ORAL
  Filled 2012-08-27 (×7): qty 1

## 2012-08-27 MED ORDER — DIPHENHYDRAMINE HCL 25 MG PO CAPS: 25.0000 mg | ORAL_CAPSULE | ORAL | Status: DC | PRN

## 2012-08-27 MED ORDER — PROMETHAZINE HCL 25 MG/ML IJ SOLN: 6.2500 mg | INTRAMUSCULAR | Status: DC | PRN

## 2012-08-27 MED ORDER — FENTANYL CITRATE 0.05 MG/ML IJ SOLN
INTRAMUSCULAR | Status: AC
  Filled 2012-08-27: qty 2

## 2012-08-27 MED ORDER — DIPHENHYDRAMINE HCL 50 MG/ML IJ SOLN: 12.5000 mg | INTRAMUSCULAR | Status: DC | PRN

## 2012-08-27 MED ORDER — SCOPOLAMINE 1 MG/3DAYS TD PT72
1.0000 | MEDICATED_PATCH | TRANSDERMAL | Status: DC
  Administered 2012-08-27: 1.5 mg via TRANSDERMAL

## 2012-08-27 MED ORDER — CITRIC ACID-SODIUM CITRATE 334-500 MG/5ML PO SOLN
30.0000 mL | Freq: Once | ORAL | Status: AC
  Administered 2012-08-27: 30 mL via ORAL
  Filled 2012-08-27: qty 15

## 2012-08-27 MED ORDER — NALBUPHINE HCL 10 MG/ML IJ SOLN
5.0000 mg | INTRAMUSCULAR | Status: DC | PRN
  Filled 2012-08-27: qty 1

## 2012-08-27 MED ORDER — LACTATED RINGERS IV SOLN
INTRAVENOUS | Status: DC | PRN
  Administered 2012-08-27: 08:00:00 via INTRAVENOUS

## 2012-08-27 SURGICAL SUPPLY — 31 items
BARRIER ADHS 3X4 INTERCEED (GAUZE/BANDAGES/DRESSINGS) IMPLANT
CLOTH BEACON ORANGE TIMEOUT ST (SAFETY) ×2 IMPLANT
CONTAINER PREFILL 10% NBF 15ML (MISCELLANEOUS) IMPLANT
DERMABOND ADVANCED (GAUZE/BANDAGES/DRESSINGS) ×1
DERMABOND ADVANCED .7 DNX12 (GAUZE/BANDAGES/DRESSINGS) ×1 IMPLANT
DRAPE LG THREE QUARTER DISP (DRAPES) ×2 IMPLANT
DRSG OPSITE POSTOP 4X10 (GAUZE/BANDAGES/DRESSINGS) ×2 IMPLANT
DURAPREP 26ML APPLICATOR (WOUND CARE) ×2 IMPLANT
ELECT REM PT RETURN 9FT ADLT (ELECTROSURGICAL) ×2
ELECTRODE REM PT RTRN 9FT ADLT (ELECTROSURGICAL) ×1 IMPLANT
EXTRACTOR VACUUM M CUP 4 TUBE (SUCTIONS) IMPLANT
GLOVE BIO SURGEON STRL SZ 6.5 (GLOVE) ×2 IMPLANT
GOWN PREVENTION PLUS LG XLONG (DISPOSABLE) ×6 IMPLANT
KIT ABG SYR 3ML LUER SLIP (SYRINGE) IMPLANT
NEEDLE HYPO 22GX1.5 SAFETY (NEEDLE) IMPLANT
NEEDLE HYPO 25X5/8 SAFETYGLIDE (NEEDLE) IMPLANT
NS IRRIG 1000ML POUR BTL (IV SOLUTION) ×2 IMPLANT
PACK C SECTION WH (CUSTOM PROCEDURE TRAY) ×2 IMPLANT
PAD OB MATERNITY 4.3X12.25 (PERSONAL CARE ITEMS) ×2 IMPLANT
SLEEVE SCD COMPRESS KNEE MED (MISCELLANEOUS) ×2 IMPLANT
STAPLER VISISTAT 35W (STAPLE) ×2 IMPLANT
SUT CHROMIC 0 CTX 36 (SUTURE) ×4 IMPLANT
SUT PLAIN 0 NONE (SUTURE) IMPLANT
SUT PLAIN 2 0 XLH (SUTURE) IMPLANT
SUT VIC AB 0 CT1 27 (SUTURE) ×3
SUT VIC AB 0 CT1 27XBRD ANBCTR (SUTURE) ×3 IMPLANT
SUT VIC AB 4-0 KS 27 (SUTURE) IMPLANT
SYR CONTROL 10ML LL (SYRINGE) IMPLANT
TOWEL OR 17X24 6PK STRL BLUE (TOWEL DISPOSABLE) ×6 IMPLANT
TRAY FOLEY CATH 14FR (SET/KITS/TRAYS/PACK) ×2 IMPLANT
WATER STERILE IRR 1000ML POUR (IV SOLUTION) ×2 IMPLANT

## 2012-08-27 NOTE — Brief Op Note (Signed)
08/27/2012  8:45 AM  PATIENT:  [name]  37 y.o. female  PRE-OPERATIVE DIAGNOSIS:  IUP at 52 w 3 days, Previous C Section, SROM  POST-OPERATIVE DIAGNOSIS:  Same  PROCEDURE:  Repeat Low Transverse Cesarean Section  SURGEON:  Surgeon(s) and Role:    * Jeani Hawking, MD - Primary  PHYSICIAN ASSISTANT:   ASSISTANTS: none   ANESTHESIA:   spinal  EBL:  Total I/O In: 2500 [I.V.:2500] Out: 850 [Urine:250; Blood:600]  BLOOD ADMINISTERED:none  DRAINS: Urinary Catheter (Foley)   LOCAL MEDICATIONS USED:  NONE  SPECIMEN:  No Specimen  DISPOSITION OF SPECIMEN:  N/A  COUNTS:  YES  TOURNIQUET:  * No tourniquets in log *  DICTATION: .Other Dictation: Dictation Number [id number]  PLAN OF CARE: Admit to inpatient   PATIENT DISPOSITION:  PACU - hemodynamically stable.   Delay start of Pharmacological VTE agent (>24hrs) due to surgical blood loss or risk of bleeding: not applicable

## 2012-08-27 NOTE — Progress Notes (Signed)
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 Has seen MFM.

## 2012-08-27 NOTE — Anesthesia Procedure Notes (Signed)
Spinal  Patient location during procedure: OR Staffing Anesthesiologist: Angus Seller., [facility] R. Performed by: anesthesiologist  Preanesthetic Checklist Completed: patient identified, site marked, surgical consent, pre-op evaluation, timeout performed, IV checked, risks and benefits discussed and monitors and equipment checked Spinal Block Patient position: sitting Prep: DuraPrep Patient monitoring: heart rate, cardiac monitor, continuous pulse ox and blood pressure Approach: midline Location: L3-4 Injection technique: single-shot Needle Needle type: Sprotte  Needle gauge: 24 G Needle length: 9 cm Assessment Sensory level: T4 Additional Notes Patient identified.  Risk benefits discussed including failed block, incomplete pain control, headache, nerve damage, paralysis, blood pressure changes, nausea, vomiting, reactions to medication both toxic or allergic, and postpartum back pain.  Patient expressed understanding and wished to proceed.  All questions were answered.  Sterile technique used throughout procedure.  CSF was clear.  No parasthesia or other complications.  Please see nursing notes for vital signs.

## 2012-08-27 NOTE — H&P (Signed)
37 year old G 2 P 1 at 40 w 6 days presents with SROM and uterine contractions. History of C Section x 1 . For repeat C Section. History of HELLP Syndrome previous pregnancy  PNC See [facility].  Afebrile VSS FHR Category 1 Lung CTAB Car RRR Abdomen is soft and non tender IMPRESSION: IUP AT 38 w 6 days SROM PREVIOUS C SECTION  PLAN: Repeat LTCS Risks reviewed with patient Consent signed.

## 2012-08-27 NOTE — Anesthesia Preprocedure Evaluation (Signed)
Anesthesia Evaluation  Patient identified by MRN, date of birth, ID band Patient awake    Reviewed: Allergy & Precautions, H&P , NPO status , Patient's Chart, lab work & pertinent test results  History of Anesthesia Complications (+) PONV  Airway Mallampati: II      Dental no notable dental hx.    Pulmonary neg pulmonary ROS,  breath sounds clear to auscultation  Pulmonary exam normal       Cardiovascular Exercise Tolerance: Good negative cardio ROS  Rhythm:regular Rate:Normal     Neuro/Psych negative neurological ROS  negative psych ROS   GI/Hepatic negative GI ROS, Neg liver ROS, GERD-  ,  Endo/Other  negative endocrine ROS  Renal/GU negative Renal ROS  negative genitourinary   Musculoskeletal   Abdominal Normal abdominal exam  (+)   Peds  Hematology negative hematology ROS (+)   Anesthesia Other Findings   Reproductive/Obstetrics (+) Pregnancy                           Anesthesia Physical Anesthesia Plan  ASA: II and emergent  Anesthesia Plan: Spinal   Post-op Pain Management:    Induction:   Airway Management Planned:   Additional Equipment:   Intra-op Plan:   Post-operative Plan:   Informed Consent: I have reviewed the patients History and Physical, chart, labs and discussed the procedure including the risks, benefits and alternatives for the proposed anesthesia with the patient or authorized representative who has indicated his/her understanding and acceptance.     Plan Discussed with: Anesthesiologist, CRNA and Surgeon  Anesthesia Plan Comments:         Anesthesia Quick Evaluation

## 2012-08-27 NOTE — MAU Note (Signed)
Patient is in with c/o contractions every 5-7minutes since 0345am and possible rupture of membranes. She reports good fetal movement. Patient states that she is scheduled for repeat c-section on 08/29/2012.

## 2012-08-27 NOTE — Anesthesia Postprocedure Evaluation (Deleted)
  Anesthesia Post-op Note  Patient: [name]  Procedure(s) Performed: Procedure(s): Primary  cesarean section with delivery of baby girl at 78. Apgars 8/9. (N/A)  Patient Location: PACU  Anesthesia Type:Spinal  Level of Consciousness: awake  Airway and Oxygen Therapy: Patient Spontanous Breathing  Post-op Pain: none  Post-op Assessment: Post-op Vital signs reviewed  Post-op Vital Signs: Reviewed and stable  Complications: No apparent anesthesia complications

## 2012-08-27 NOTE — Anesthesia Postprocedure Evaluation (Signed)
Anesthesia Post Note  Patient: [name]  Procedure(s) Performed: Procedure(s) (LRB): Primary  cesarean section with delivery of baby girl at 71. Apgars 8/9. (N/A)  Anesthesia type: Spinal  Patient location: PACU  Post pain: Pain level controlled  Post assessment: Post-op Vital signs reviewed  Last Vitals:  Filed Vitals:   08/27/12 1000  BP: 112/47  Pulse: 77  Temp: 36.8 C  Resp: 20    Post vital signs: Reviewed  Level of consciousness: awake  Complications: No apparent anesthesia complications

## 2012-08-27 NOTE — Op Note (Signed)
[name], [name]            ACCOUNT NO.:  [id number]  MEDICAL RECORD NO.:  [id number]  LOCATION:  [facility]                          FACILITY:  [facility]  PHYSICIAN:  Ezechiel Stooksbury L. Alicia Ackert, M.D.DATE OF BIRTH:  [birth year]  DATE OF PROCEDURE:  08/27/2012 DATE OF DISCHARGE:                              OPERATIVE REPORT   PREOPERATIVE DIAGNOSES:  Intrauterine pregnancy at 38 weeks and 3 days, previous cesarean section, and spontaneous rupture of membranes.  POSTOPERATIVE DIAGNOSES:  Intrauterine pregnancy at 38 weeks and 3 days, previous cesarean section, and spontaneous rupture of membranes.  PROCEDURE:  Repeat low transverse cesarean section.  SURGEON:  Kalan Rinn L. Vincente Poli, M.D.  ANESTHESIA:  Spinal.  ESTIMATED BLOOD LOSS:  Less than 500 mL.  COMPLICATIONS:  None.  PATHOLOGY:  None.  PROCEDURE:  The patient was taken to the operating room.  Spinal was placed without incident.  She was prepped and draped.  A Foley catheter was inserted.  A low transverse incision was made, carried down to the fascia.  Fascia was scored in the midline and extended laterally. Rectus muscles were separated in the midline.  The peritoneum was entered bluntly.  The peritoneal incision was then stretched.  The bladder flap was inserted.  The bladder flap was then created sharply and then digitally.  The bladder blade was then readjusted.  A low transverse incision was made in the uterus.  Uterus was entered using a hemostat.  Amniotic fluid was clear.  The baby was in cephalic presentation, was delivered easily with a female infant, Apgars 9 at one minute and 9 at five minutes.  There was a loose body cord x1.  The cord was clamped and cut.  The baby was handed to the awaiting neonatal team. The placenta was manually removed, noted to be normal intact with a three-vessel cord.  Uterus was exteriorized and cleared of all clots and debris.  The uterine incision was closed in 1 layer using 0 chromic in  a running locked stitch.  The uterus was returned to the abdomen. Irrigation was performed.  Hemostasis was excellent.  The peritoneum and rectus muscles were closed with 0 Vicryl and a running stitch.  The fascia was closed using 0 Vicryl, starting each corner and meeting in the midline with a running stitch.  After irrigation of subcutaneous layer, the skin was closed with a 4-0 Vicryl on a Keith needle in subcuticular fashion.  Dermabond was applied.  All sponge, lap, and instrument counts were correct x2.  The patient went to recovery room in stable condition.     [name], M.D.     [name]  D:  08/27/2012  T:  08/27/2012  Job:  [id number]

## 2012-08-27 NOTE — Anesthesia Postprocedure Evaluation (Signed)
  Anesthesia Post-op Note  Patient: [name]  Procedure(s) Performed: Procedure(s): Primary  cesarean section with delivery of baby girl at 73. Apgars 8/9. (N/A)  Patient Location: Mother/Baby  Anesthesia Type:Spinal  Level of Consciousness: awake  Airway and Oxygen Therapy: Patient Spontanous Breathing  Post-op Pain: mild  Post-op Assessment: Patient's Cardiovascular Status Stable and Respiratory Function Stable  Post-op Vital Signs: stable  Complications: No apparent anesthesia complications

## 2012-08-27 NOTE — Progress Notes (Signed)
Dr Vincente Poli notified of patient. Her history of previous c-section, tracing, ctx pattern, and srom. Order to prep patient patient for c-section.

## 2012-08-27 NOTE — OR Nursing (Addendum)
Uterus massaged by S. Raylinn Kosar RN. Two tubes of cord blood sent to lab.  15cc of blood evacuated from uterus during uterine massage. 

## 2012-08-27 NOTE — Transfer of Care (Addendum)
Immediate Anesthesia Transfer of Care Note  Patient: [name]  Procedure(s) Performed: Procedure(s): Primary  cesarean section with delivery of baby girl at 47. Apgars 8/9. (N/A)  Patient Location: PACU  Anesthesia Type:Spinal  Level of Consciousness: awake  Airway & Oxygen Therapy: Patient Spontanous Breathing  Post-op Assessment: Report given to PACU RN  Post vital signs: Reviewed  Complications: No apparent anesthesia complications

## 2012-08-28 LAB — CBC
Hemoglobin: 10.4 g/dL — ABNORMAL LOW (ref 12.0–15.0)
MCHC: 33.9 g/dL (ref 30.0–36.0)
Platelets: 104 10*3/uL — ABNORMAL LOW (ref 150–400)
RBC: 3.12 MIL/uL — ABNORMAL LOW (ref 3.87–5.11)

## 2012-08-28 MED ORDER — CEFAZOLIN SODIUM-DEXTROSE 2-3 GM-% IV SOLR: 2.0000 g | INTRAVENOUS | Status: DC

## 2012-08-28 NOTE — Progress Notes (Signed)
CSW attempted to meet with MOB to complete assessment for PPD.  MOB was sleeping so CSW will attempt again at a later time. 

## 2012-08-28 NOTE — Progress Notes (Signed)
Subjective: Postpartum Day 2 Cesarean Delivery Patient reports incisional pain, tolerating PO, + flatus and no problems voiding.    Objective: Vital signs in last 24 hours: Temp:  [97.6 F (36.4 C)-99.3 F (37.4 C)] 97.8 F (36.6 C) (02/09 0412) Pulse Rate:  [55-79] 74 (02/09 0412) Resp:  [16-21] 18 (02/09 0412) BP: (88-120)/(46-70) 88/52 mmHg (02/09 0412) SpO2:  [95 %-100 %] 96 % (02/09 0412) Weight:  [67.586 kg (149 lb)] 67.586 kg (149 lb) (02/08 1200)  Physical Exam:  General: alert, cooperative and appears stated age Lochia: appropriate Uterine Fundus: firm Incision: healing well, no significant drainage, no dehiscence, no significant erythema DVT Evaluation: No evidence of DVT seen on physical exam.   Recent Labs  08/27/12 0725 08/28/12 0550  HGB 12.3 10.4*  HCT 35.9* 30.7*    Assessment/Plan: Status post Cesarean section. Doing well postoperatively.  Continue current care History of HELLP With first pregnancy, platelet count 115,000 will recheck CBC tomorrow am.Discussed with patient  Dontez Hauss L 08/28/2012, 7:30 AM

## 2012-08-29 ENCOUNTER — Encounter (HOSPITAL_COMMUNITY): Payer: Self-pay | Admitting: Obstetrics and Gynecology

## 2012-08-29 ENCOUNTER — Inpatient Hospital Stay (HOSPITAL_COMMUNITY)
Admission: RE | Admit: 2012-08-29 | Payer: BC Managed Care – PPO | Source: Ambulatory Visit | Admitting: Obstetrics and Gynecology

## 2012-08-29 ENCOUNTER — Encounter (HOSPITAL_COMMUNITY): Admission: RE | Payer: Self-pay | Source: Ambulatory Visit

## 2012-08-29 LAB — CBC
HCT: 30.6 % — ABNORMAL LOW (ref 36.0–46.0)
MCV: 99.7 fL (ref 78.0–100.0)
Platelets: 113 10*3/uL — ABNORMAL LOW (ref 150–400)
RBC: 3.07 MIL/uL — ABNORMAL LOW (ref 3.87–5.11)
RDW: 14.1 % (ref 11.5–15.5)
WBC: 10.3 10*3/uL (ref 4.0–10.5)

## 2012-08-29 SURGERY — Surgical Case
Anesthesia: Regional

## 2012-08-29 MED ORDER — IBUPROFEN 600 MG PO TABS: 600.0000 mg | ORAL_TABLET | Freq: Four times a day (QID) | ORAL | Status: AC

## 2012-08-29 MED ORDER — OXYCODONE-ACETAMINOPHEN 5-325 MG PO TABS: 1.0000 | ORAL_TABLET | ORAL | Status: AC | PRN

## 2012-08-29 NOTE — Clinical Social Work Psychosocial (Signed)
    Clinical Social Work Department BRIEF PSYCHOSOCIAL ASSESSMENT 08/29/2012  Patient:  [name], [name]     Account Number:  [id number]     Admit date:  08/27/2012  Clinical Social Worker:  Melene Plan  Date/Time:  08/29/2012 01:01 PM  Referred by:  Physician  Date Referred:  08/29/2012 Referred for  Behavioral Health Issues   Other Referral:   Hx of PP depression   Interview type:  Patient Other interview type:    PSYCHOSOCIAL DATA Living Status:  HUSBAND Admitted from facility:   Level of care:   Primary support name:  Jake Seats Primary support relationship to patient:  SPOUSE Degree of support available:   Involved    CURRENT CONCERNS Current Concerns  Behavioral Health Issues   Other Concerns:    SOCIAL WORK ASSESSMENT / PLAN Pt experienced PP depression symptoms for about 1 year after she delivered in 2009.  Her symptoms were treated with Zoloft until symptoms resolved.  She denies any depression since then and reports feeling fine now.  Pt agrees to seek medical attention again, if PP depression symptoms arise.  CSW provided pt with PP depression brochure.  Pt appears to be appropriate and bonding well at this time.   Assessment/plan status:  No Further Intervention Required Other assessment/ plan:   Information/referral to community resources:   PP depression literature provided.    PATIENT'S/FAMILY'S RESPONSE TO PLAN OF CARE: Pt thanked CSW for consult/resources.

## 2012-08-29 NOTE — Discharge Summary (Signed)
Obstetric Discharge Summary Reason for Admission: rupture of membranes and previous h/o of cesarean and HELLP with 1st pregnancy Prenatal Procedures: ultrasound Intrapartum Procedures: cesarean: low cervical, transverse Postpartum Procedures: none Complications-Operative and Postpartum: none Hemoglobin  Date Value Range Status  08/29/2012 10.2* 12.0 - 15.0 [name]/dL Final     HCT  Date Value Range Status  08/29/2012 30.6* 36.0 - 46.0 % Final    Physical Exam:  General: alert and cooperative Lochia: appropriate Uterine Fundus: firm Incision: healing well, small ecchymosis noted inferior to incisional site, no seroma , no errythema DVT Evaluation: No evidence of DVT seen on physical exam. No significant calf/ankle edema. DTR's 1+ No RUQ tenderness Does complain of frontal HA , no visual disturbance   Discharge Diagnoses: Term Pregnancy-delivered  Discharge Information: Date: 08/29/2012 Activity: pelvic rest Diet: routine Medications: PNV, Ibuprofen and Percocet Condition: stable Instructions: refer to practice specific booklet Discharge to: home   Newborn Data: Live born female  Birth Weight: 7 lb 4.9 oz (3315 [name]) APGAR: 8, 9  Home with mother.  [name] 08/29/2012, 8:15 AM

## 2012-09-19 ENCOUNTER — Encounter (HOSPITAL_COMMUNITY): Payer: BC Managed Care – PPO

## 2012-09-19 ENCOUNTER — Ambulatory Visit (HOSPITAL_COMMUNITY)
Admission: RE | Admit: 2012-09-19 | Discharge: 2012-09-19 | Disposition: A | Discharge status: Home / Self Care | Payer: BC Managed Care – PPO | Source: Ambulatory Visit | Attending: Obstetrics and Gynecology | Admitting: Obstetrics and Gynecology

## 2012-09-19 NOTE — Lactation Note (Addendum)
Adult Lactation Consultation Outpatient Visit Note  Patient Name: [name] [facility]                               Elizabeth Girl Emmanuelle "[name]", DOB [birth year], Birth weight 7 lb. 4 oz.  Date of Birth: 08-21-1975                                              Now 54 weeks old Gestational Age at Delivery: Unknown Type of Delivery: C/S  Breastfeeding History: Frequency of Breastfeeding: 8-12 times in 24 hours, some cluster feeding Length of Feeding: 15-20 minutes, 1 breast a feeding.  Voids: 6-8 Stools: 6 on average/yellow  Supplementing / Method: Pumping:  Type of Pump:  Ameda   Frequency:  No pumping yet  Volume:    Comments: Mom is here for feeding assessment. Mom reports she started having bleeding from the left nipple within the 1st week after birth of [name]. She went to see [name] last week and was given a nipple shield to use as needed with the latch. Mom had also called her OB and was given a RX for Clotrimazole ointment with Betamethasone to use as needed. She was advised to add Neosporin to this ointment per [name] and she is using this 3-4 time/day. Mom reports some improvement since starting the ointment. Mom reports the left nipple is very painful with the initial latch but improves and she is able to breast feed. Occasionally she will use the nipple shield to get past the pain of the initial latch, then remove the nipple shield after 5 minutes and breastfeed [name].  The right nipple is sore but not as sore as the left since it does not have the cracking and the end of the nipple. Mom reports Elizabeth [name] will pull on and off the breast with breastfeeding and she sometimes gets choked at the breast. She will spit up after some feedings when this happens. She thinks her milk comes out too fast for her at times.    Consultation Evaluation: On exam, Mom's right nipple is red but no cracking or bleeding. The left nipple is cracked in the center and red. Assisted Mom to latch Elizabeth [name]  to the right breast first. Mom latched Elizabeth [name] using football hold. Had Mom massage her breast and hand express prior to latching to get her milk flow going. Demonstrated how to obtain depth with the latch, keep chin tucked in well. The initial latch was painful but the discomfort decreased as Elizabeth [name] was breastfeeding. Mom's nipple was round with slight compression line on the lower half of the nipple. The last few minutes of breastfeeding on this side, Elizabeth [name] began to pull back at the breast. Had Mom take her off and attempt to burp her. She did not choke or spit up. With the left breast, again had Mom massage and hand express prior to latching. Mom latched [name] in football hold, the initial latch was very painful, but the discomfort improved but did not completely go away as the Elizabeth continued to breastfeed. Elizabeth [name] breastfed on the left breast for 7 minutes then became choked a little pulling on the breast then came off the breast and Mom placed [name] on her chest and attempted to burp her.  She did not spit up after this feeding. Mom's nipple was round with open area at the end of the nipple and red, no active bleeding.  Initial Feeding Assessment: Pre-feed Weight:  7 lb. 15.8 oz/3624 gm Post-feed Weight:  8 lb. 2.2 oz/3690 gm Amount Transferred:  66 ml Comments:  With breastfeeding for 15 minutes on the right breast  Additional Feeding Assessment: Pre-feed Weight:  8 lb. 2.2 oz/3690 gm Post-feed Weight:  8 lb. 2.8 oz/3708 gm Amount Transferred:  18 ml. Comments:  With breastfeeding on the left breast for 7 minutes  Additional Feeding Assessment: Pre-feed Weight: Post-feed Weight: Amount Transferred: Comments:  Total Breast milk Transferred this Visit: 84 ml. Total Supplement Given: None  Additional Interventions: Mom will continue to use Clotrimazole ointment with Neosporin for the rest of this week since her nipples are feeling better with this treatment. Care for sore  nipples reviewed, comfort gels given with instructions to alternate with Clotrimazole, not to use together. Apply EBM as well to promote healing. Reviewed how to obtain more depth with latching [name] and the importance of good positioning and support. Keep the chin tucked in well. Encouraged Mom to continue to massage and hand express prior to latching [name].This will also keep her nipples compressible. If she starts choking or pulling on/off the breast then stop breastfeeding attempt to burp her then re-latch after few minutes to finish her feeding. Mom has been breastfeeding in laid back position sometimes and advised this is a good position if she has a forceful milk ejection.  Mom reports she will use nipple shield if needed, but plans to continue to breastfeed without the nipple shield.    Follow-Up Will call if desires follow up.      [name] 09/19/2012, 1:17 PM

## 2013-01-02 IMAGING — US US OB FOLLOW-UP
1 series · 12 of 28 positions shown · non-contrast
Comparison: none

[Series 1: us ob follow-up · 0.22mm/px · 12 of 51 slices shown]
[im 2/51]
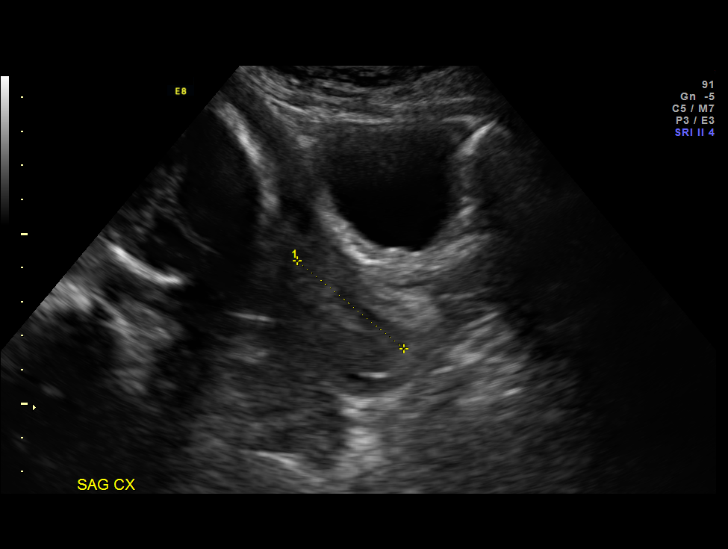
[im 6/51]
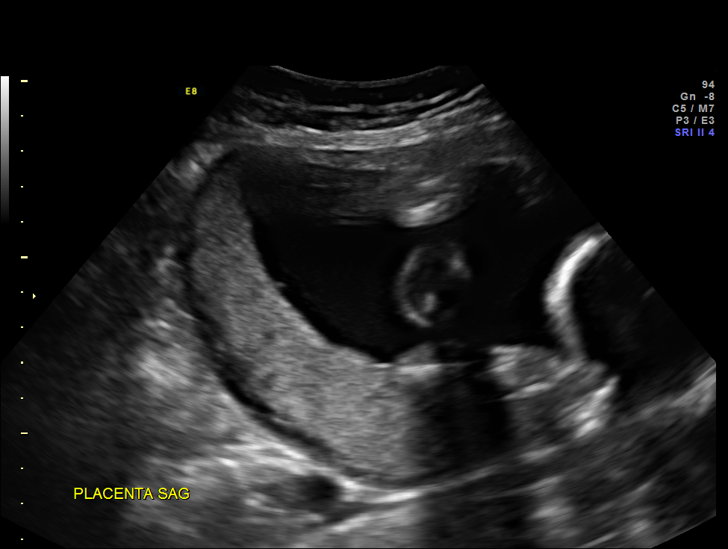
[im 10/51]
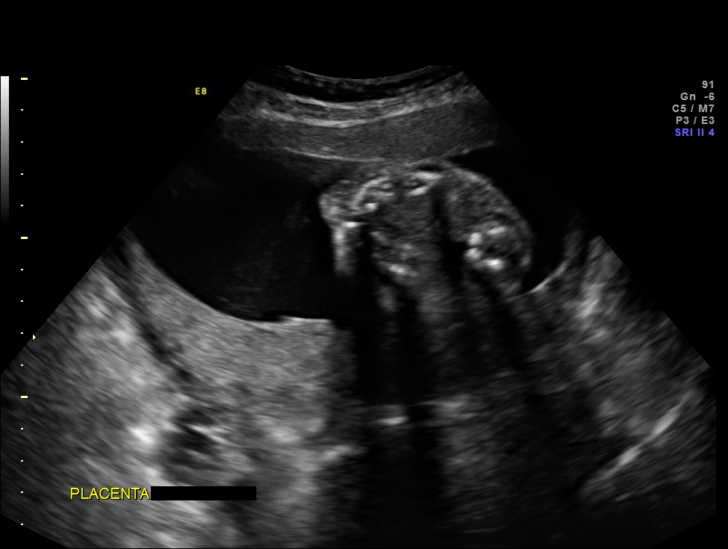
[im 15/51]
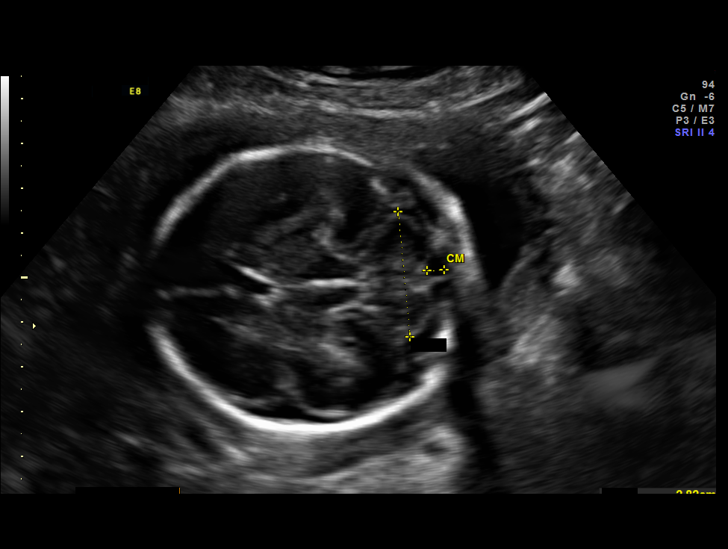
[im 19/51]
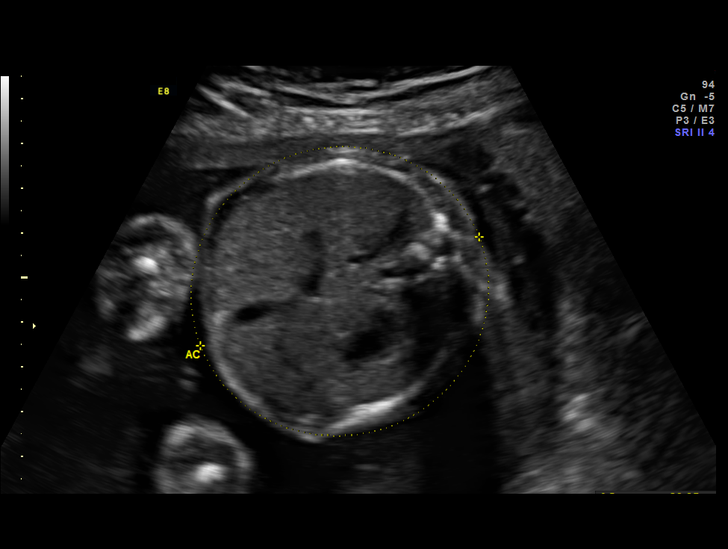
[im 23/51]
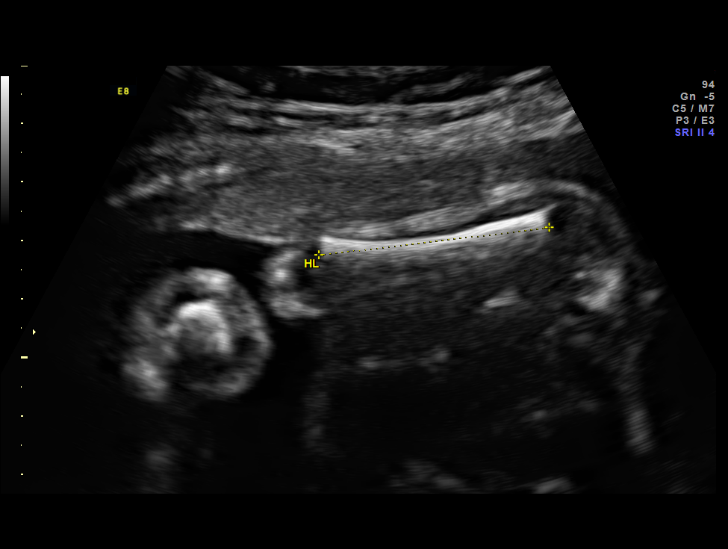
[im 28/51]
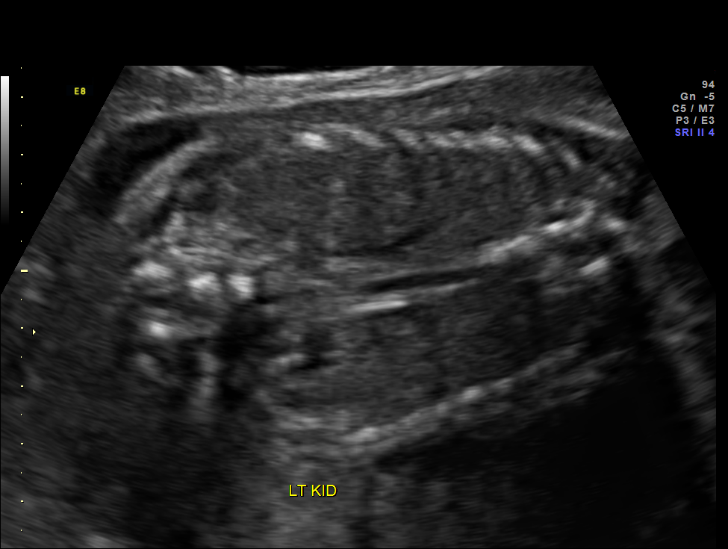
[im 32/51]
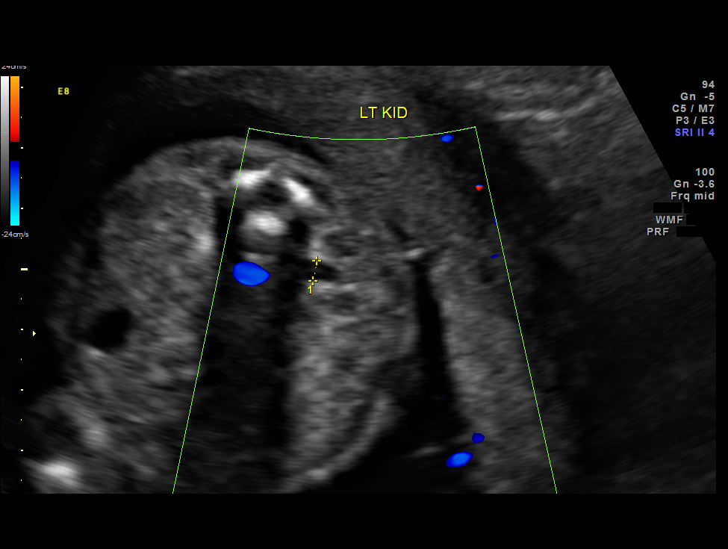
[im 36/51]
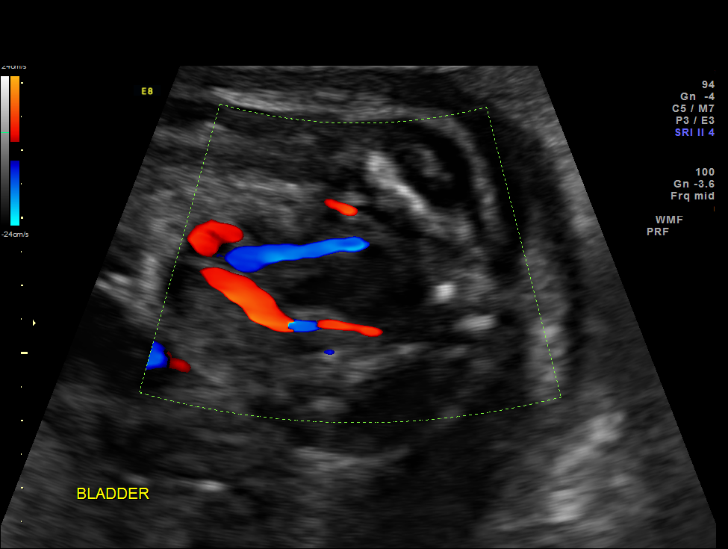
[im 41/51]
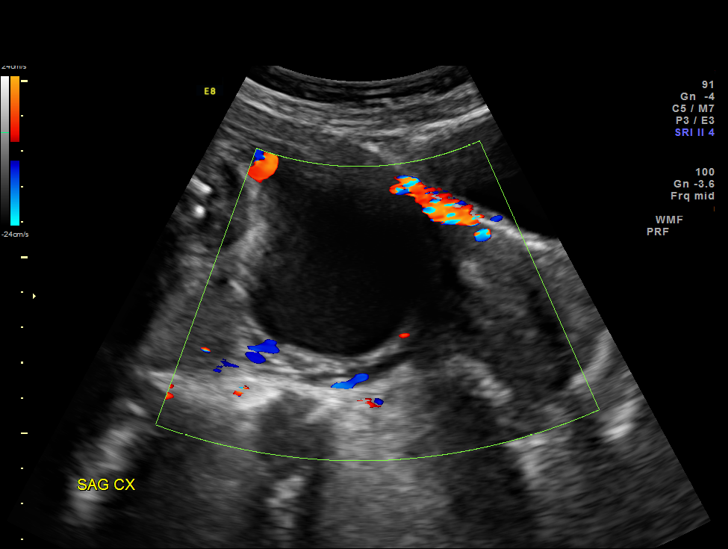
[im 45/51]
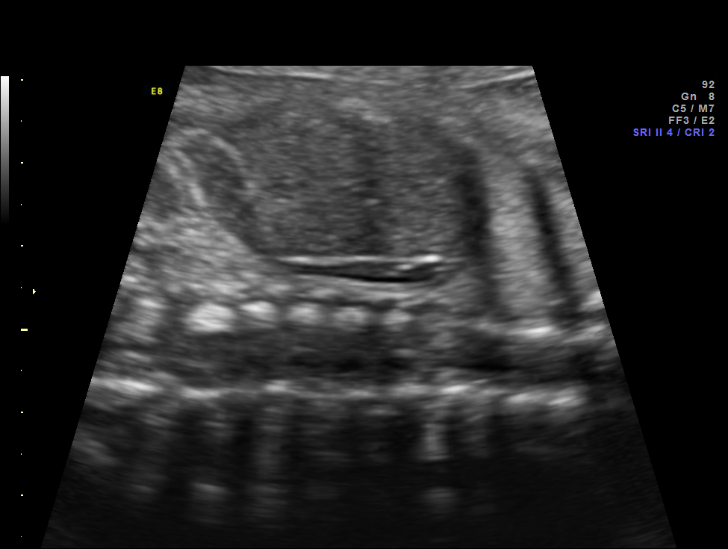
[im 49/51]
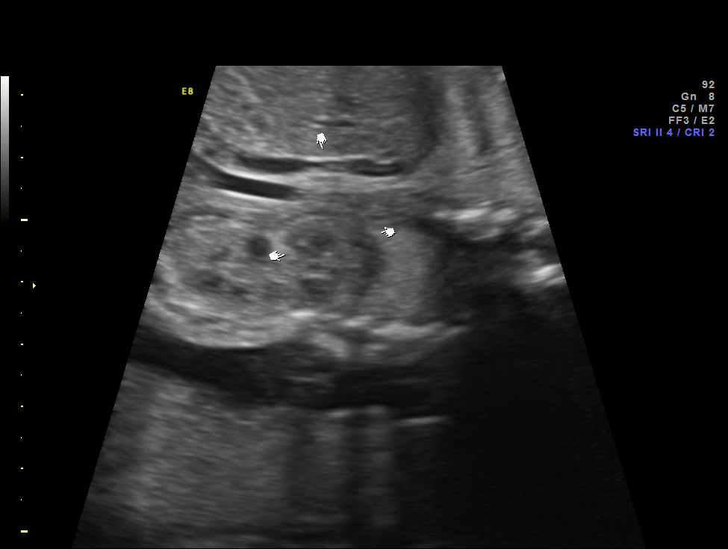

[12 of 28 positions shown; findings below may reference images not displayed]

OBSTETRICS REPORT
                      (Signed Final 05/19/2012 [DATE])

Service(s) Provided

 US OB FOLLOW UP                                       76816.1
Indications

 Fetal abnormality - other known or suspected
 (unilateral renal agenesis)
 Poor obstetric history: Previous preeclampsia /
 eclampsia/gestational HTN
 Previous cesarean section
 Placenta previa/Low lying: No bleeding
Fetal Evaluation

 Num Of Fetuses:    1
 Fetal Heart Rate:  150                          bpm
 Cardiac Activity:  Observed
 Presentation:      Cephalic
 Placenta:          Posterior, above cervical
                    os
 P. Cord            Previously Visualized
 Insertion:

 Amniotic Fluid
 AFI FV:      Subjectively within normal limits
                                             Larg Pckt:     5.5  cm
Biometry

 BPD:     62.8  mm     G. Age:  25w 3d                CI:         81.3   70 - 86
 OFD:     77.2  mm                                    FL/HC:      19.2   18.7 -

 HC:     224.5  mm     G. Age:  24w 3d       27  %    HC/AC:      1.08   1.04 -

 AC:     207.6  mm     G. Age:  25w 3d       65  %    FL/BPD:     68.8   71 - 87
 FL:      43.2  mm     G. Age:  24w 1d       24  %    FL/AC:      20.8   20 - 24
 HUM:     39.7  mm     G. Age:  24w 1d       34  %
 CER:     28.2  mm     G. Age:  25w 1d       64  %

 Est. FW:     739  gm    1 lb 10 oz      57  %
Gestational Age

 LMP:           24w 4d        Date:  11/29/11                 EDD:   09/04/12
 U/S Today:     24w 6d                                        EDD:   09/02/12
 Best:          24w 4d     Det. By:  LMP  (11/29/11)          EDD:   09/04/12
Anatomy

 Cranium:          Appears normal         Aortic Arch:      Previously seen
 Fetal Cavum:      Appears normal         Ductal Arch:      Previously seen
 Ventricles:       Appears normal         Diaphragm:        Appears normal
 Choroid Plexus:   Previously seen        Stomach:          Appears normal, left
                                                            sided
 Cerebellum:       Previously seen        Abdomen:          Appears normal
 Posterior Fossa:  Previously seen        Abdominal Wall:   Previously seen
 Nuchal Fold:      Previously seen        Cord Vessels:     Appears normal (3
                                                            vessel cord)
 Face:             Orbits and profile     Kidneys:          Absent right kidney
                   previously seen
 Lips:             Previously seen        Bladder:          Appears normal
 Heart:            Appears normal         Spine:            Previously seen
                   (4CH, axis, and
                   situs)
 RVOT:             Previously seen        Lower             Previously seen
                                          Extremities:
 LVOT:             Previously seen        Upper             Previously seen
                                          Extremities:

 Other:  Fetus appears to be a female. Heels and 5th digit perviously seen.
Targeted Anatomy

 Fetal Central Nervous System
 Cisterna Magna:
Cervix Uterus Adnexa

 Cervical Length:    4        cm

 Cervix:       Normal appearance by transabdominal scan.
Impression

 IUP at 24+4 weeks
 Unilateral renal agenesis
 All other interval fetal anatomy was seen and appeared
 normal
 Normal amniotic fluid volume
 Appropriate interval growth with EFW at the 57th %tile
 Low lying placenta - resolved
Recommendations

 Follow-up ultrasound in 8 weeks to reassess growth, AFV,
 and kidney

## 2013-03-07 IMAGING — US US OB FOLLOW-UP
1 series · 12 of 28 positions shown · non-contrast
Comparison: none

[Series 1: us ob follow-up · 0.20mm/px · 12 of 37 slices shown]
[im 2/37]
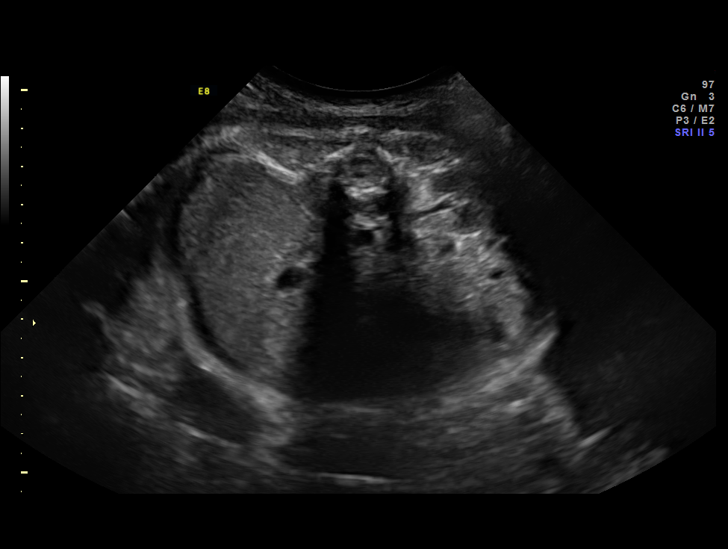
[im 5/37]
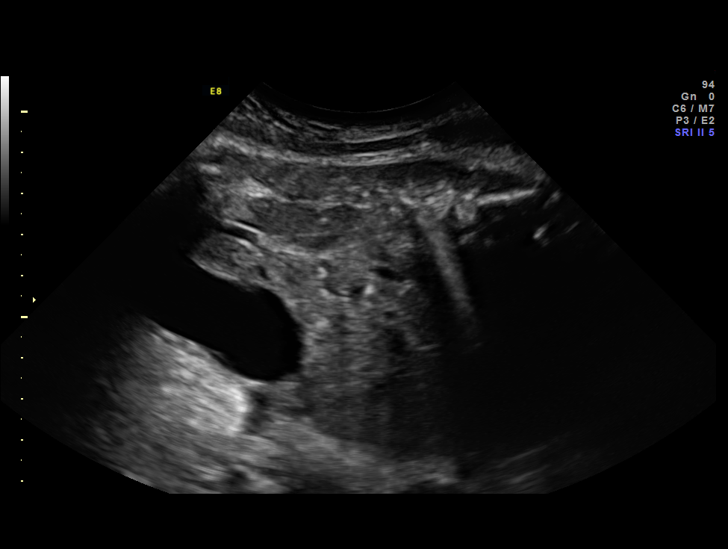
[im 7/37]
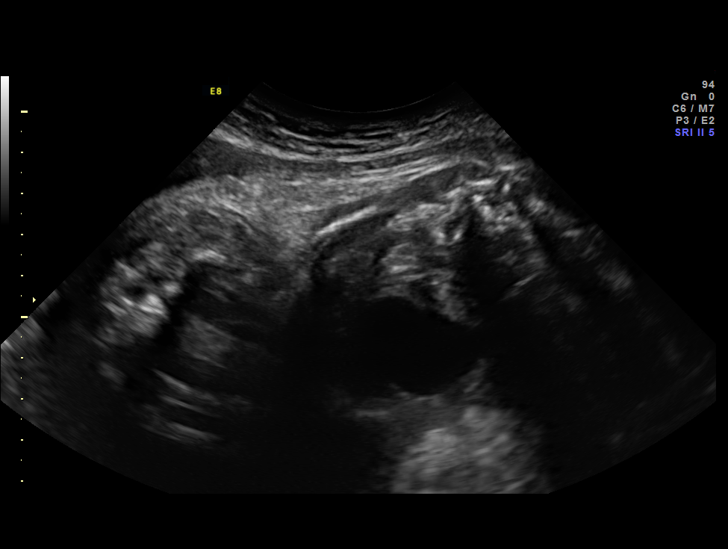
[im 11/37]
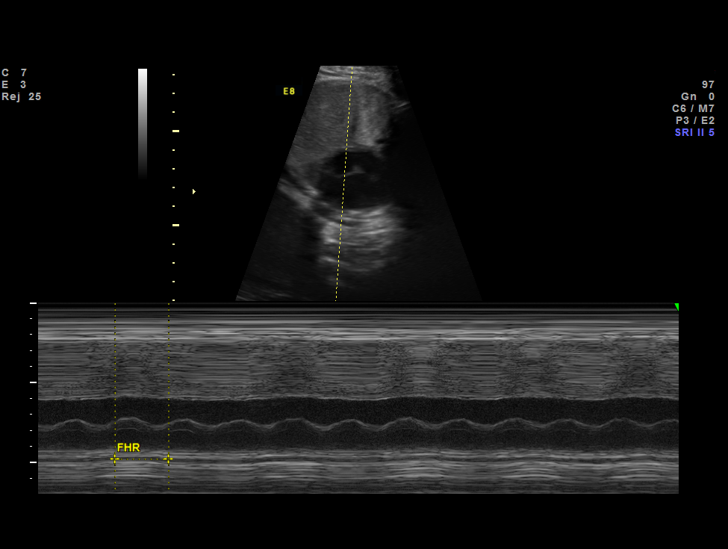
[im 14/37]
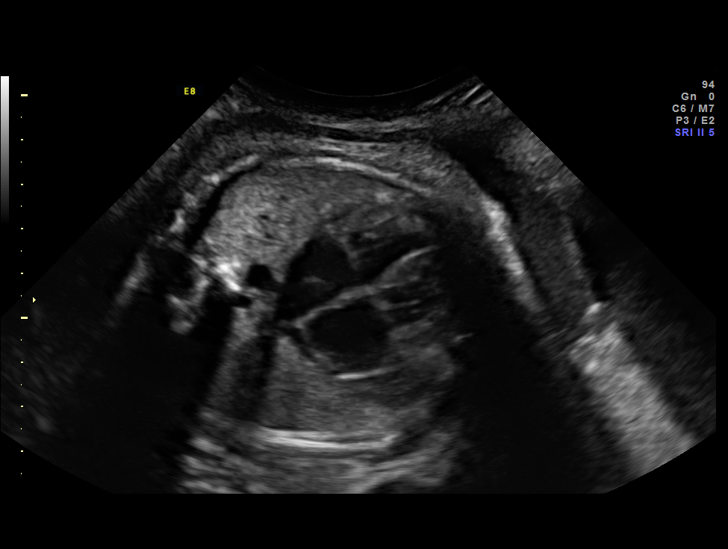
[im 17/37]
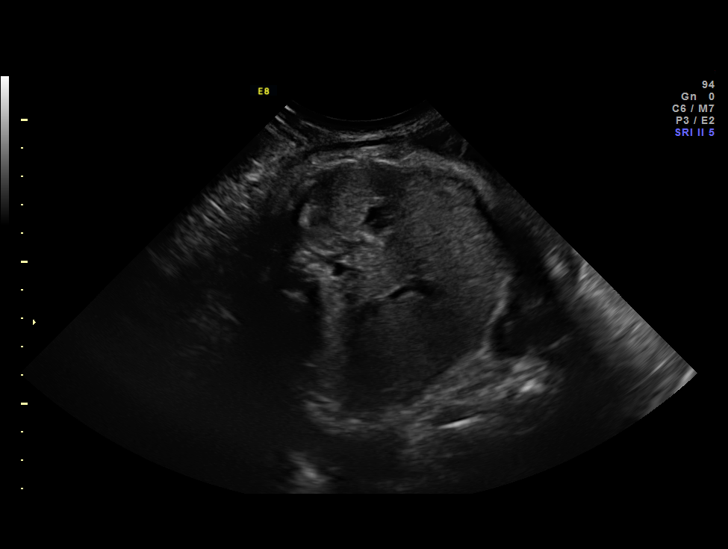
[im 21/37]
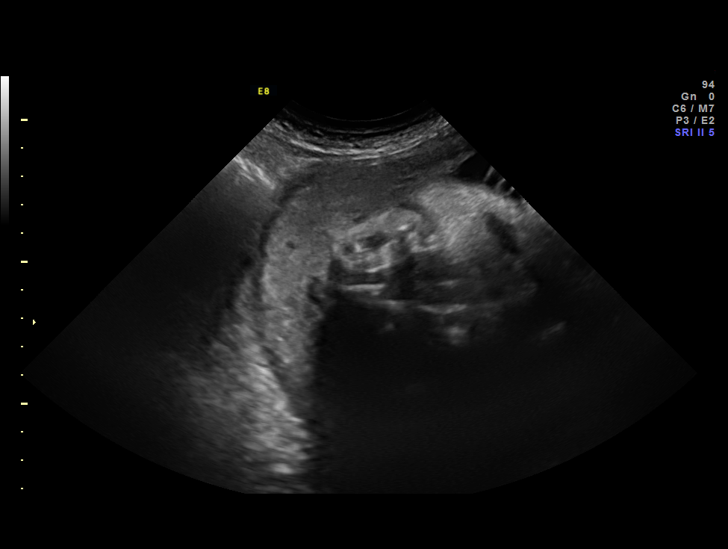
[im 23/37]
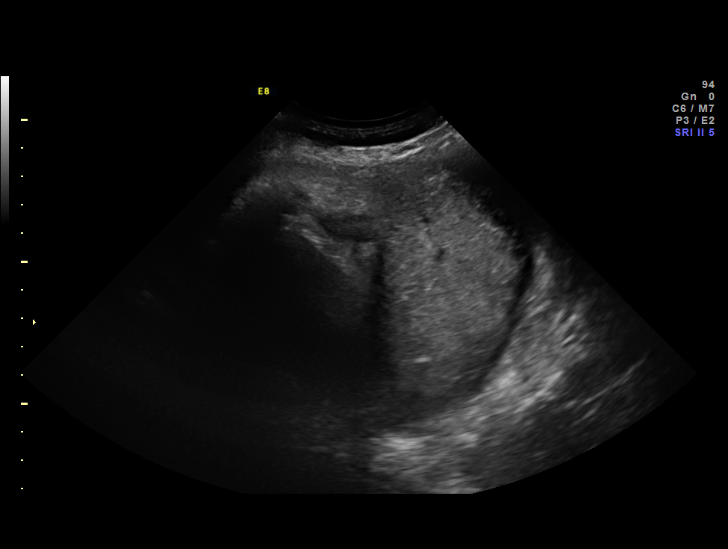
[im 26/37]
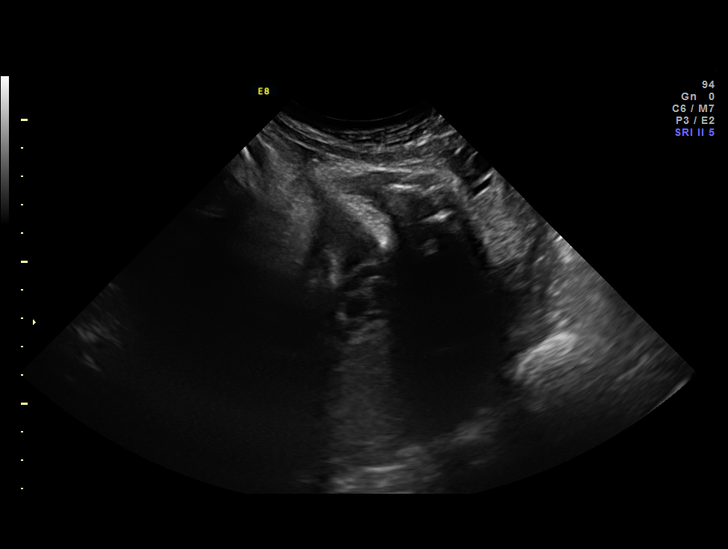
[im 30/37]
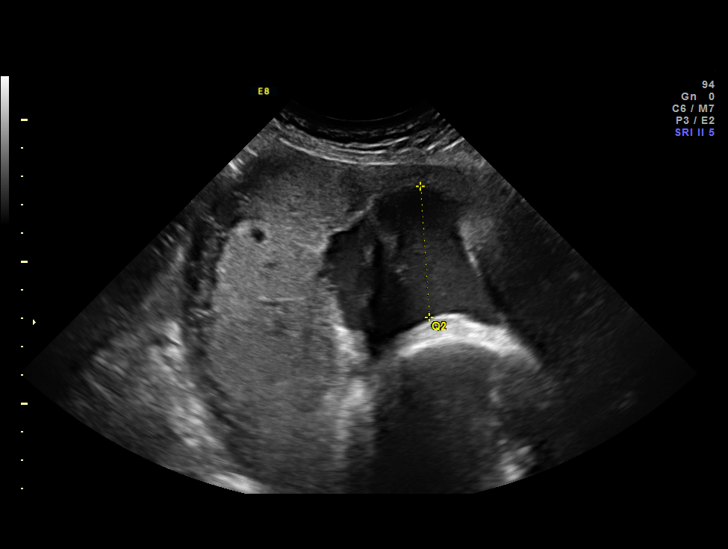
[im 33/37]
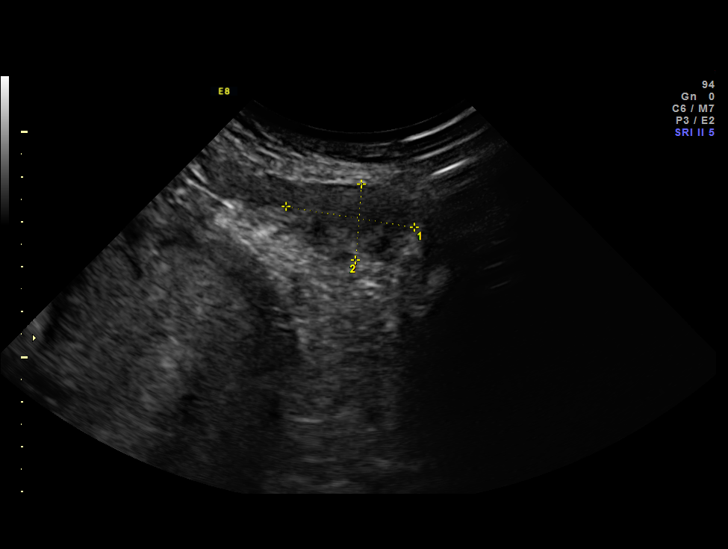
[im 35/37]
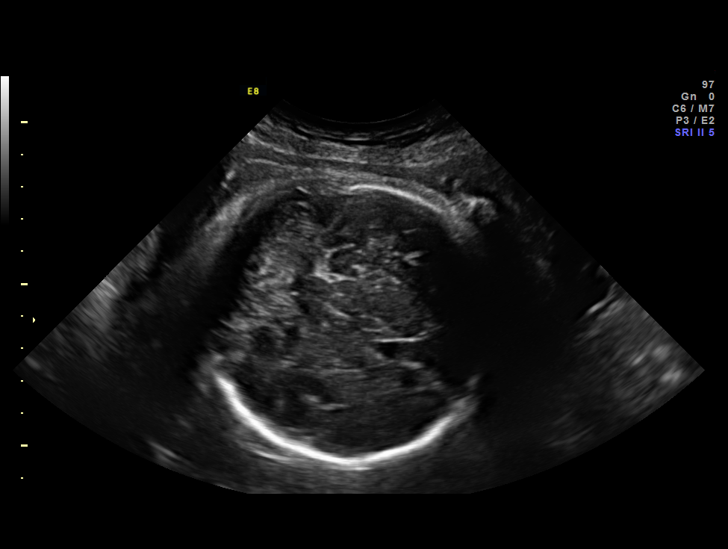

[12 of 28 positions shown; findings below may reference images not displayed]

OBSTETRICS REPORT
                      (Signed Final 07/22/2012 [DATE])

Service(s) Provided

 US OB FOLLOW UP                                       76816.1
Indications

 Fetal abnormality - other known or suspected
 (unilateral renal agenesis)
 Poor obstetric history: Previous preeclampsia /
 eclampsia/gestational HTN
 Previous cesarean section
 Placenta previa/Low lying: No bleeding
Fetal Evaluation

 Num Of Fetuses:    1
 Fetal Heart Rate:  121                          bpm
 Cardiac Activity:  Observed
 Presentation:      Cephalic
 Placenta:          Posterior, above cervical
                    os

 Amniotic Fluid
 AFI FV:      Subjectively within normal limits
 AFI Sum:     10.69   cm       24  %Tile     Larg Pckt:    6.05  cm
 RUQ:   6.05    cm   LUQ:    4.64   cm
Biometry

 BPD:     87.6  mm     G. Age:  35w 3d                CI:         84.2   70 - 86
 OFD:      104  mm                                    FL/HC:      19.7   19.4 -

 HC:     303.6  mm     G. Age:  33w 5d       17  %    HC/AC:      1.02   0.96 -

 AC:       298  mm     G. Age:  33w 6d       55  %    FL/BPD:     68.3   71 - 87
 FL:      59.8  mm     G. Age:  31w 1d      < 3  %    FL/AC:      20.1   20 - 24

 Est. FW:    6298  gm    4 lb 11 oz      48  %
Gestational Age

 LMP:           33w 5d        Date:  11/29/11                 EDD:   09/04/12
 U/S Today:     33w 4d                                        EDD:   09/05/12
 Best:          33w 5d     Det. By:  LMP  (11/29/11)          EDD:   09/04/12
Anatomy

 Cranium:          Appears normal         Aortic Arch:      Previously seen
 Fetal Cavum:      Appears normal         Ductal Arch:      Previously seen
 Ventricles:       Appears normal         Diaphragm:        Appears normal
 Choroid Plexus:   Previously seen        Stomach:          Appears normal, left
                                                            sided
 Cerebellum:       Appears normal         Abdomen:          Appears normal
 Posterior Fossa:  Previously seen        Abdominal Wall:   Previously seen
 Nuchal Fold:      Previously seen        Cord Vessels:     Appears normal (3
                                                            vessel cord)
 Face:             Orbits and profile     Kidneys:          Absent right kidney
                   previously seen
 Lips:             Previously seen        Bladder:          Appears normal
 Heart:            Appears normal         Spine:            Previously seen
                   (4CH, axis, and
                   situs)
 RVOT:             Previously seen        Lower             Previously seen
                                          Extremities:
 LVOT:             Previously seen        Upper             Previously seen
                                          Extremities:

 Other:  Fetus appears to be a female. Heels and 5th digit perviously seen.
Cervix Uterus Adnexa

 Cervix:       Not visualized (advanced GA >40wks)
 Left Ovary:    Within normal limits.
 Right Ovary:   Not visualized.
 Adnexa:     No abnormality visualized.
Impression

 IUP at 33 + 5 weeks
 Unilateral renal agenesis
 All other interval fetal anatomy was seen and appeared
 normal
 Normal amniotic fluid volume
 Appropriate interval growth with EFW at the 48th %tile
Recommendations

 Recommend follow up ultrasound in 3 weeks to reevaluate
 growth and amniotic fluid.

## 2013-03-28 IMAGING — US US OB FOLLOW-UP
1 series · 13 of 28 positions shown · non-contrast
Comparison: none

[Series 1: us ob follow-up · 0.24mm/px · 13 of 38 slices shown]
[im 2/38]
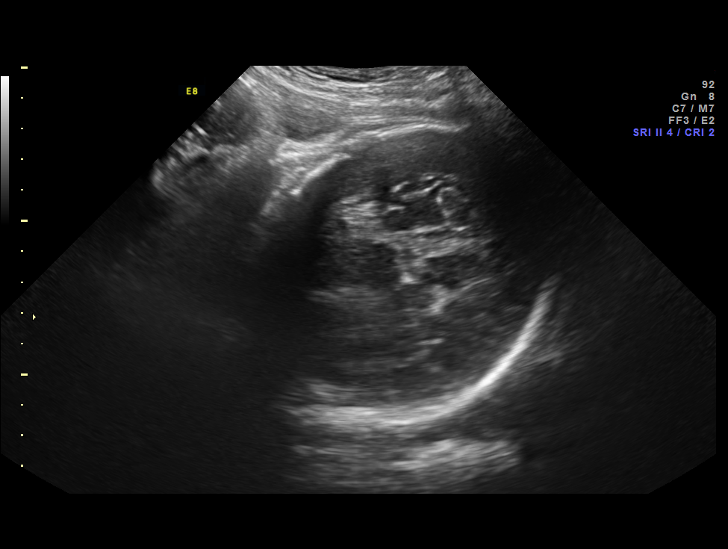
[im 5/38]
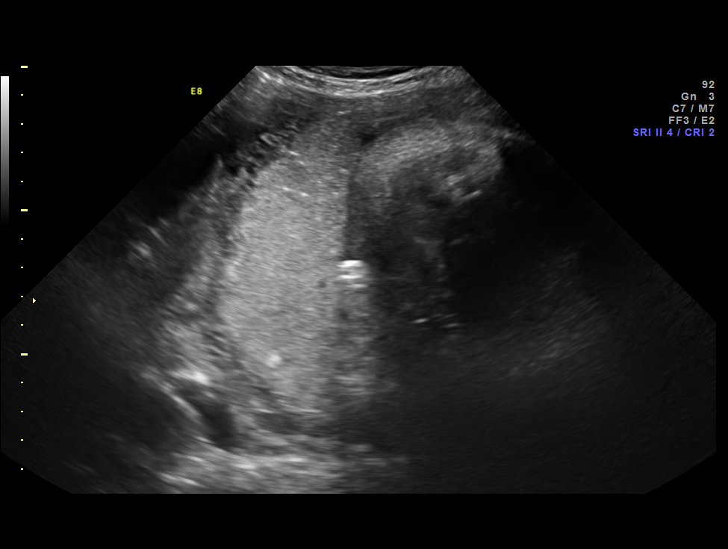
[im 7/38]
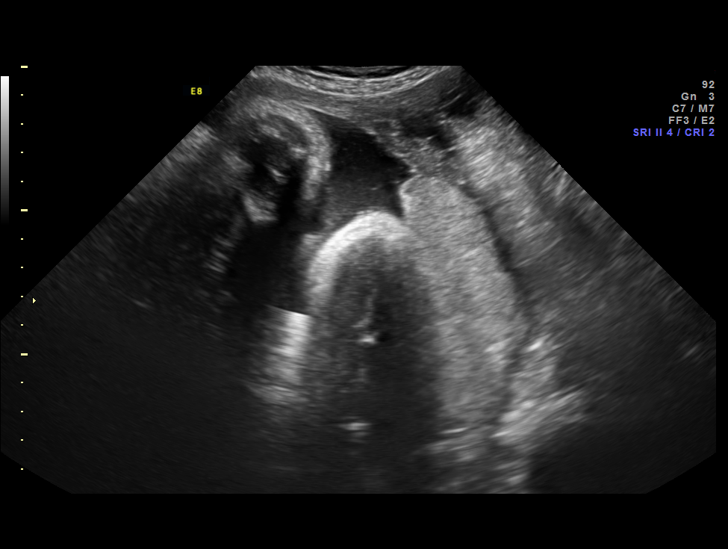
[im 10/38]
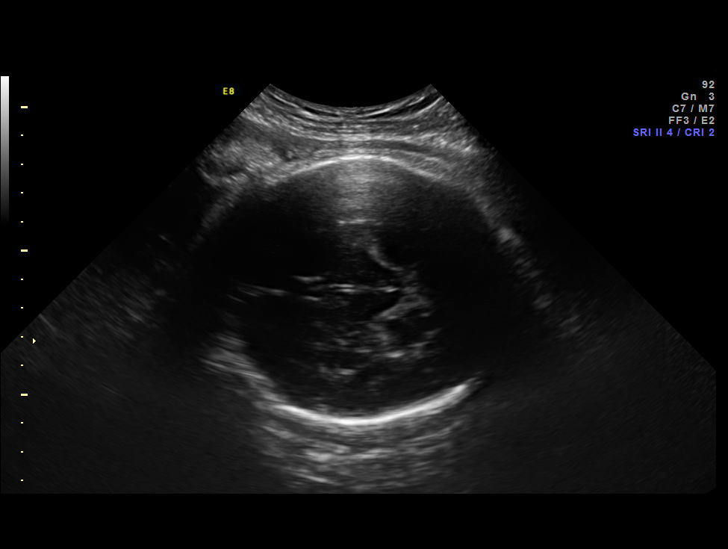
[im 13/38]
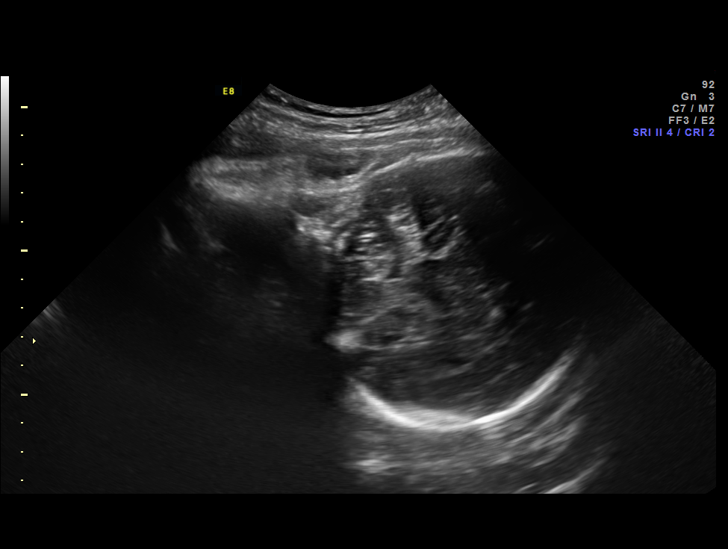
[im 16/38]
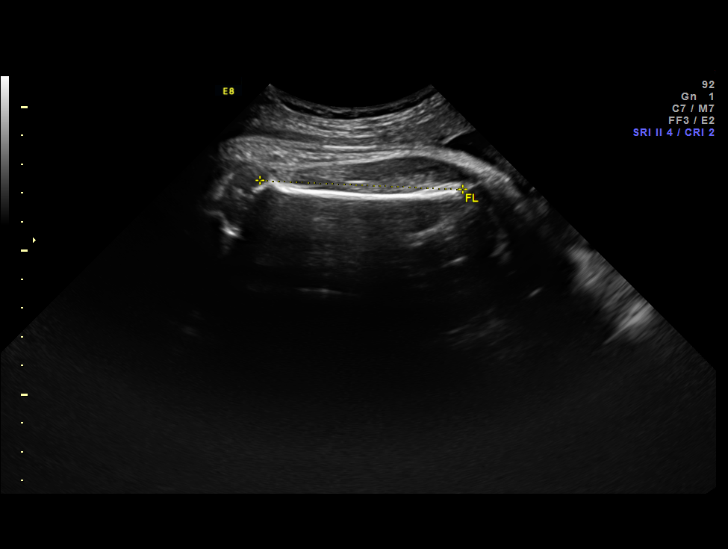
[im 20/38]
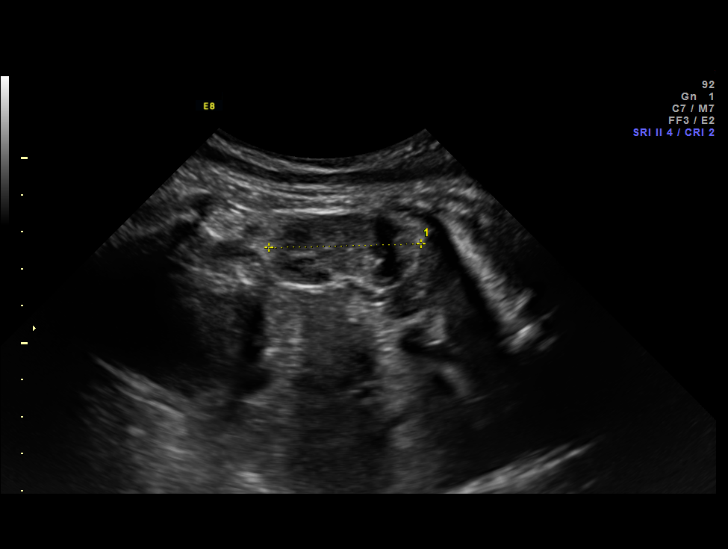
[im 22/38]
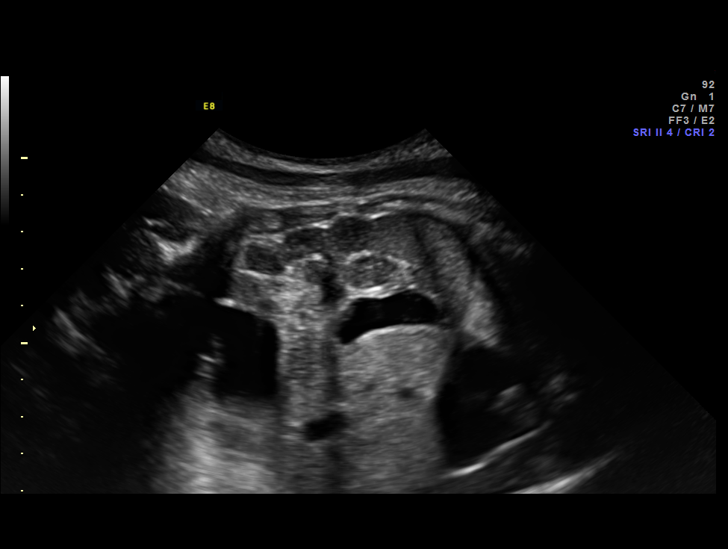
[im 25/38]
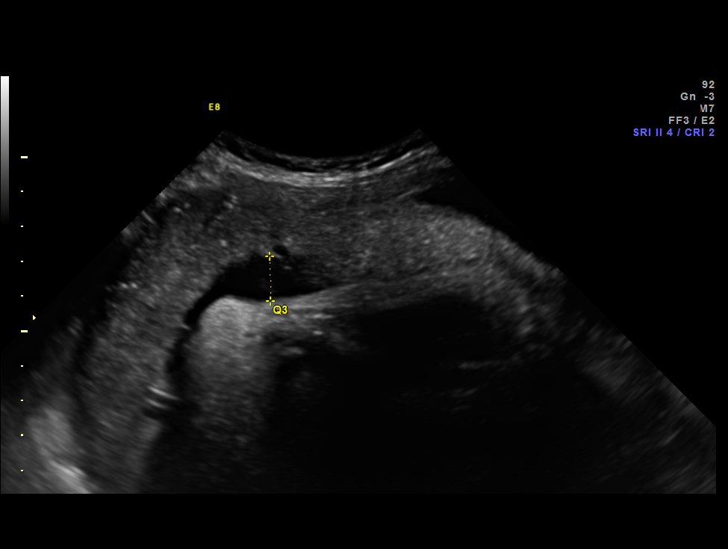
[im 28/38]
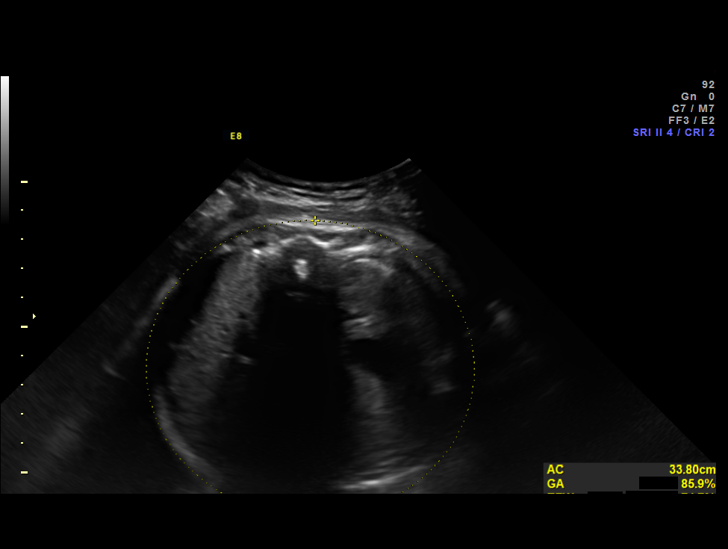
[im 31/38]
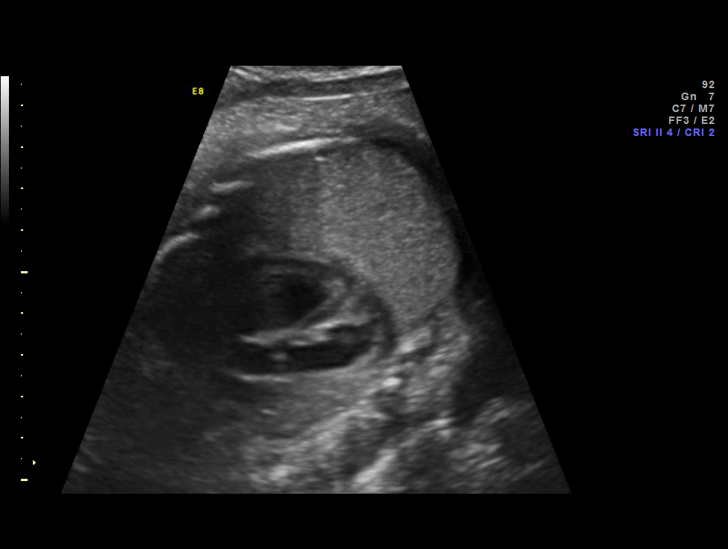
[im 33/38]
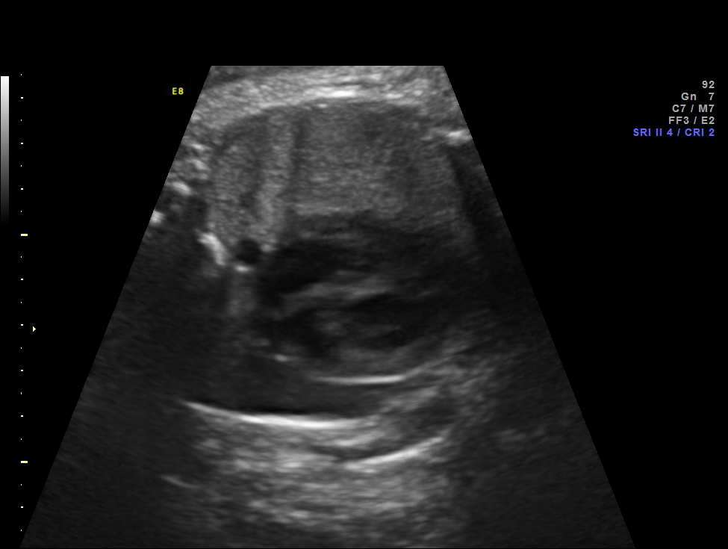
[im 36/38]
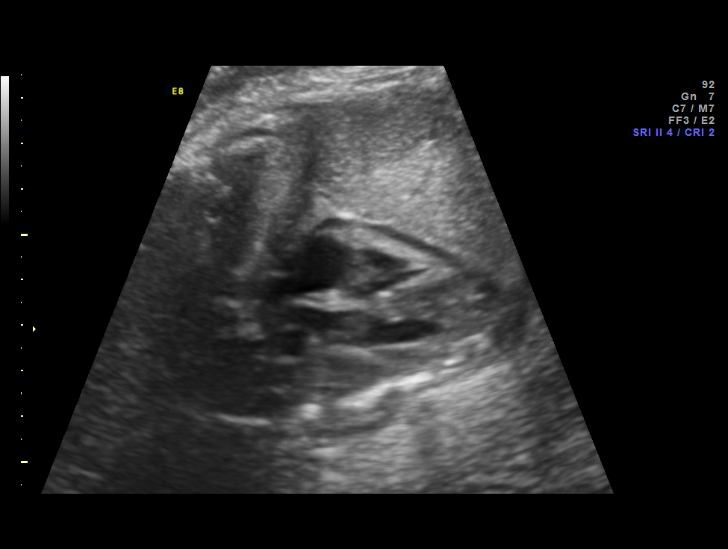

[13 of 28 positions shown; findings below may reference images not displayed]

OBSTETRICS REPORT
                      (Signed Final 08/12/2012 [DATE])

Service(s) Provided

 US OB FOLLOW UP                                       76816.1
Indications

 Fetal abnormality - other known or suspected
 (unilateral renal agenesis)
 Poor obstetric history: Previous preeclampsia /
 eclampsia/gestational HTN
 Previous cesarean section
 Placenta previa/Low lying: No bleeding - resolved
Fetal Evaluation

 Num Of Fetuses:    1
 Fetal Heart Rate:  138                          bpm
 Cardiac Activity:  Observed
 Presentation:      Cephalic
 Placenta:          Posterior, above cervical
                    os
 P. Cord            Previously Visualized
 Insertion:

 Amniotic Fluid
 AFI FV:      Subjectively within normal limits
 AFI Sum:     12.46   cm       42  %Tile     Larg Pckt:    3.93  cm
 RUQ:   3.93    cm   RLQ:    3.81   cm    LUQ:   3.44    cm   LLQ:    1.28   cm
Biometry

 BPD:     91.6  mm     G. Age:  37w 1d                CI:         79.2   70 - 86
 OFD:    115.7  mm                                    FL/HC:      21.2   20.8 -

 HC:     328.3  mm     G. Age:  37w 2d       37  %    HC/AC:      1.00   0.92 -

 AC:     329.3  mm     G. Age:  36w 6d       66  %    FL/BPD:     76.0   71 - 87
 FL:      69.6  mm     G. Age:  35w 5d       24  %    FL/AC:      21.1   20 - 24
 HUM:     60.7  mm     G. Age:  35w 1d       39  %

 Est. FW:    1665  gm    6 lb 10 oz      66  %
Gestational Age

 LMP:           36w 5d        Date:  11/29/11                 EDD:   09/04/12
 U/S Today:     36w 5d                                        EDD:   09/04/12
 Best:          36w 5d     Det. By:  LMP  (11/29/11)          EDD:   09/04/12
Anatomy

 Cranium:          Appears normal         LVOT:             Appears normal
 Ventricles:       Appears normal         Stomach:          Appears normal, left
                                                            sided
 Heart:            Appears normal         Kidneys:          Absent right kidney
                   (4CH, axis, and
                   situs)
 RVOT:             Appears normal         Bladder:          Appears normal
Cervix Uterus Adnexa

 Cervix:       Not visualized (advanced GA >17wks)
Impression

 IUP at 36+5 weeks
 Unilateral renal agenesis - appeared to be isolated
 All other interval fetal anatomy was seen and appeared
 normal
 Normal amniotic fluid volume
 Appropriate interval growth with EFW at the 66th %tile
Recommendations

 Follow-up as clinically indicated

## 2013-06-05 ENCOUNTER — Other Ambulatory Visit: Payer: Self-pay | Admitting: Obstetrics and Gynecology

## 2013-06-05 DIAGNOSIS — N6452 Nipple discharge: Secondary | ICD-10-CM

## 2013-06-07 ENCOUNTER — Ambulatory Visit
Admission: RE | Admit: 2013-06-07 | Discharge: 2013-06-07 | Disposition: A | Discharge status: Home / Self Care | Payer: BC Managed Care – PPO | Source: Ambulatory Visit | Attending: Obstetrics and Gynecology | Admitting: Obstetrics and Gynecology

## 2013-06-07 DIAGNOSIS — N6452 Nipple discharge: Secondary | ICD-10-CM

## 2013-07-17 ENCOUNTER — Emergency Department (HOSPITAL_COMMUNITY)
Admission: EM | Admit: 2013-07-17 | Discharge: 2013-07-17 | Discharge status: Against Medical Advice | Payer: BC Managed Care – PPO | Attending: Emergency Medicine | Admitting: Emergency Medicine

## 2013-07-17 ENCOUNTER — Encounter (HOSPITAL_COMMUNITY): Payer: Self-pay | Admitting: Emergency Medicine

## 2013-07-17 DIAGNOSIS — R5381 Other malaise: Secondary | ICD-10-CM | POA: Insufficient documentation

## 2013-07-17 DIAGNOSIS — I10 Essential (primary) hypertension: Secondary | ICD-10-CM | POA: Insufficient documentation

## 2013-07-17 DIAGNOSIS — R42 Dizziness and giddiness: Secondary | ICD-10-CM | POA: Insufficient documentation

## 2013-07-17 DIAGNOSIS — R110 Nausea: Secondary | ICD-10-CM | POA: Insufficient documentation

## 2013-07-17 LAB — GLUCOSE, CAPILLARY: Glucose-Capillary: 113 mg/dL — ABNORMAL HIGH (ref 70–99)

## 2013-07-17 NOTE — ED Notes (Signed)
Pt at dinner and started feeling like she was intoxicated; room spinning; nausea; felt lethargic; felt like she was going to fall asleep; pt states she now feels like she is coming out of it; started on tramadol and anti fungal on 12/15 for mastitis--- took tramadol today and feels may be related

## 2013-07-17 NOTE — ED Notes (Signed)
Pt to desk stating she was feeling better; states has an appointment tomorrow with her physician; advised to return at any time

## 2014-05-21 ENCOUNTER — Encounter (HOSPITAL_COMMUNITY): Payer: Self-pay | Admitting: Emergency Medicine

## 2014-10-23 ENCOUNTER — Other Ambulatory Visit: Payer: Self-pay | Admitting: Obstetrics and Gynecology

## 2014-10-24 LAB — CYTOLOGY - PAP

## 2017-04-27 ENCOUNTER — Encounter: Payer: Self-pay | Admitting: Rheumatology

## 2017-04-27 ENCOUNTER — Ambulatory Visit (INDEPENDENT_AMBULATORY_CARE_PROVIDER_SITE_OTHER): Payer: Self-pay | Admitting: Rheumatology

## 2017-04-27 VITALS — BP 97/68 | HR 79 | Resp 14 | Ht 62.0 in | Wt 129.0 lb

## 2017-04-27 DIAGNOSIS — M542 Cervicalgia: Secondary | ICD-10-CM

## 2017-04-27 DIAGNOSIS — F32A Depression, unspecified: Secondary | ICD-10-CM | POA: Insufficient documentation

## 2017-04-27 DIAGNOSIS — F329 Major depressive disorder, single episode, unspecified: Secondary | ICD-10-CM | POA: Diagnosis not present

## 2017-04-27 DIAGNOSIS — K9041 Non-celiac gluten sensitivity: Secondary | ICD-10-CM

## 2017-04-27 DIAGNOSIS — Z8719 Personal history of other diseases of the digestive system: Secondary | ICD-10-CM

## 2017-04-27 DIAGNOSIS — F4541 Pain disorder exclusively related to psychological factors: Secondary | ICD-10-CM

## 2017-04-27 DIAGNOSIS — F419 Anxiety disorder, unspecified: Secondary | ICD-10-CM | POA: Diagnosis not present

## 2017-04-27 DIAGNOSIS — E739 Lactose intolerance, unspecified: Secondary | ICD-10-CM | POA: Insufficient documentation

## 2017-04-27 DIAGNOSIS — R768 Other specified abnormal immunological findings in serum: Secondary | ICD-10-CM | POA: Diagnosis not present

## 2017-04-27 DIAGNOSIS — R5383 Other fatigue: Secondary | ICD-10-CM

## 2017-04-27 NOTE — Progress Notes (Signed)
Office Visit Note  Patient: [name]             Date of Birth: 1976/06/06           MRN: [id number]             PCP: Berkley Harvey, NP Referring: Juanita Craver, M.D. Visit Date: 04/27/2017 Occupation: '@GUAROCC'$ @    Subjective:  Positive ANA.   History of Present Illness: [name] is a 41 y.o. female seen in consultation per request of Dr. Juanita Craver. According to patient her symptoms a started at age 85 with the rash around her neck. She states she was treated with topical steroids. 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 She states she has episodic muscle and joint pain for several years. She got pregnant in 2009 and suffered from HELLP syndrome. Her second pregnancy was in 2014 during which she suffered from bilateral carpal tunnel syndrome and equal veins tenosynovitis. She also had recurrent mastitis in the postpartum period she states she had postpartum depression after both pregnancies. She started having episodes of diarrhea and constipation. She states she had Mirena IUD which caused increasing her loss and weight gain and it was removed. She had a very stressful last year as her husband kidney transplant. She was seen with Dr. Collene Mares for abdominal bloating constipation and diarrhea who diagnosed her with IBS, gluten and triopathy and lactose intolerance. She had done better on gluten and dairy free diet. She states she was diagnosed with OCD in February and was placed on Vibryd and Wellbutrin. She had to discontinue medication as it caused increased GI symptoms. She is still having withdrawal from those medications and some anxiety and depression issues. She's been experiencing increased fatigue and she falls asleep during meetings. She also states that she has some brain fog. She gives history of chronic headaches and nausea but no vomiting. April 2018 she started having increased right shoulder joint pain for which she was seen in [place]  [facility]. She states she had MRI of her C-spine and July 2018 which was consistent with disc disease of cervical spine and pinched nerve. She went through physical therapy for 2 months without much results. She's been going to chiropractor about twice a week. She continues to have neck and shoulder pain and some numbness in her right hand. She gives history of occasional lower back pain. She denies any history of joint swelling. She has occasional rash.  Activities of Daily Living:  Patient reports morning stiffness for 0 minute.   Patient Denies nocturnal pain.  Difficulty dressing/grooming: Denies Difficulty climbing stairs: Denies Difficulty getting out of chair: Denies Difficulty using hands for taps, buttons, cutlery, and/or writing: Denies   Review of Systems  Constitutional: Positive for fatigue and weight gain. Negative for night sweats, weight loss and weakness.  HENT: Negative for mouth sores, trouble swallowing, trouble swallowing, mouth dryness and nose dryness.   Eyes: Positive for dryness. Negative for pain, redness and visual disturbance.  Respiratory: Negative for cough, shortness of breath and difficulty breathing.   Cardiovascular: Negative for chest pain, palpitations, hypertension, irregular heartbeat and swelling in legs/feet.  Gastrointestinal: Positive for constipation and diarrhea. Negative for blood in stool.  Endocrine: Negative for increased urination.  Genitourinary: Negative for vaginal dryness.  Musculoskeletal: Positive for arthralgias, joint pain, myalgias and myalgias. Negative for joint swelling, muscle weakness, morning stiffness and muscle tenderness.  Skin: Positive for rash and hair loss. Negative for color  change, skin tightness, ulcers and sensitivity to sunlight.  Allergic/Immunologic: Negative for susceptible to infections.  Neurological: Positive for headaches and memory loss. Negative for dizziness and night sweats.  Hematological: Negative for  swollen glands.  Psychiatric/Behavioral: Positive for depressed mood. Negative for sleep disturbance. The patient is nervous/anxious.     PMFS History:  Patient Active Problem List   Diagnosis Date Noted  . History of IBS 04/27/2017  . Anxiety and depression 04/27/2017  . Stress headaches 04/27/2017  . Lactose intolerance 04/27/2017  . Gluten enteropathy 04/27/2017  . DEPRESSIVE DISORDER 03/14/2009  . TOE PAIN 03/14/2009  . HELLP SYNDROME 04/17/2008    Past Medical History:  Diagnosis Date  . GERD (gastroesophageal reflux disease)   . PONV (postoperative nausea and vomiting)     No family history on file. Past Surgical History:  Procedure Laterality Date  . CESAREAN SECTION  2009  . CESAREAN SECTION N/A 08/27/2012   Procedure: Primary  cesarean section with delivery of baby girl at 28. Apgars 8/9.;  Surgeon: Cyril Mourning, MD;  Location: [facility];  Service: Obstetrics;  Laterality: N/A;  . WISDOM TOOTH EXTRACTION  2008   Social History   Social History Narrative  . No narrative on file     Objective: Vital Signs: BP 97/68   Pulse 79   Resp 14   Ht '5\' 2"'$  (1.575 m)   Wt 129 lb (58.5 kg)   BMI 23.59 kg/m    Physical Exam  Constitutional: She is oriented to person, place, and time. She appears well-developed and well-nourished.  HENT:  Head: Normocephalic and atraumatic.  Eyes: Conjunctivae and EOM are normal.  Neck: Normal range of motion.  Cardiovascular: Normal rate, regular rhythm, normal heart sounds and intact distal pulses.   Pulmonary/Chest: Effort normal and breath sounds normal.  Abdominal: Soft. Bowel sounds are normal.  Lymphadenopathy:    She has no cervical adenopathy.  Neurological: She is alert and oriented to person, place, and time.  Skin: Skin is warm and dry. Capillary refill takes less than 2 seconds.  Psychiatric: She has a normal mood and affect. Her behavior is normal.  Nursing note and vitals reviewed.    Musculoskeletal Exam:  C-spine and thoracic lumbar spine good range of motion. No SI joint tenderness. Shoulder joints elbow joints wrist joint MCPs PIPs DIPs with good range of motion with no synovitis. Hip joints knee joints ankles MTPs PIPs DIPs with good range of motion with no synovitis.  CDAI Exam: No CDAI exam completed.    Investigation: Findings:  05/04/2016 IgA normal, endomysial antibody negative, anti-tTG negativeline 04/01/2017 labs from [facility]: UA moderate blood, pregnancy test negative, B12 normal, CBC normal, CMP normal, ESR 10, EBV titer negative, ANA 1:160NS    Imaging: No results found.  Speciality Comments: No specialty comments available.    Procedures:  No procedures performed Allergies: Nickel   Assessment / Plan:     Visit Diagnoses: Neck pain patient has been having neck pain and some right-sided radiculopathy. For which she is been seen in [place]. She states she was diagnosed with "pinch nerve". She's tried physical therapy without results and now going to chiropractor.  Positive ANA (antinuclear antibody): She has low titer ANA. She also gives history of fatigue and recurrent rash for several years. I will obtain following labs to evaluate this further.  History of IBS: She gives history IBS for several years for which she's been seeing Dr. Collene Mares.   Anxiety and depression:  She's been under a lot of stress and had recent diagnosis of OCD. She recently came off her medications and having difficult time.  Stress headaches  Lactose intolerance: The lactose free diet has been helpful.  Gluten enteropathy: She is on gluten-free diet.  Other fatigue : She's been experiencing a lot of fatigue. I will obtain following labs today.   Orders: Orders Placed This Encounter  Procedures  . CK  . TSH  . Anti-scleroderma antibody  . Sjogrens syndrome-A extractable nuclear antibody  . Sjogrens syndrome-B extractable nuclear antibody  . Anti-Smith  antibody  . RNP Antibody  . Anti-DNA antibody, double-stranded  . C3 and C4  . Beta-2 glycoprotein antibodies  . Cardiolipin antibodies, IgG, IgM, IgA  . Lupus Anticoagulant Eval w/Reflex  . Cyclic citrul peptide antibody, IgG  . Rheumatoid factor  . Serum protein electrophoresis with reflex  . VITAMIN D 25 Hydroxy (Vit-D Deficiency, Fractures)  . Pan-ANCA   No orders of the defined types were placed in this encounter.   Face-to-face time spent with patient was 60 minutes. Greater than 50% of time was spent in counseling and coordination of care.  Follow-Up Instructions: Return for Positive ANA.   Bo Merino, MD  Note - This record has been created using Editor, commissioning.  Chart creation errors have been sought, but may not always  have been located. Such creation errors do not reflect on  the standard of medical care.

## 2017-04-30 LAB — PAN-ANCA
ANCA SCREEN: NEGATIVE
Myeloperoxidase Abs: <1 AI
Serine Protease 3: <1 AI

## 2017-04-30 LAB — LUPUS ANTICOAGULANT EVAL W/ REFLEX
DRVVT SCREEN: 42 s (ref <=45)
PTT LA SCREEN: 35 s (ref <=40)

## 2017-04-30 LAB — PROTEIN ELECTROPHORESIS, SERUM, WITH REFLEX
ALBUMIN ELP: 4.6 g/dL (ref 3.8–4.8)
ALPHA 1: 0.3 g/dL (ref 0.2–0.3)
Alpha 2: 0.7 g/dL (ref 0.5–0.9)
Beta 2: 0.3 g/dL (ref 0.2–0.5)
Beta Globulin: 0.5 g/dL (ref 0.4–0.6)
GAMMA GLOBULIN: 1.2 g/dL (ref 0.8–1.7)
TOTAL PROTEIN: 7.7 g/dL (ref 6.1–8.1)

## 2017-04-30 LAB — SJOGRENS SYNDROME-B EXTRACTABLE NUCLEAR ANTIBODY: SSB (LA) (ENA) ANTIBODY, IGG: NEGATIVE AI

## 2017-04-30 LAB — BETA-2 GLYCOPROTEIN ANTIBODIES
Beta-2 Glyco 1 IgM: 20 SMU (ref <=20)
Beta-2 Glyco I IgG: <9 SGU (ref <=20)

## 2017-04-30 LAB — ANTI-DNA ANTIBODY, DOUBLE-STRANDED: DS DNA AB: 5 [IU]/mL — AB

## 2017-04-30 LAB — ANTI-SCLERODERMA ANTIBODY: Scleroderma (Scl-70) (ENA) Antibody, IgG: <1 AI

## 2017-04-30 LAB — RHEUMATOID FACTOR

## 2017-04-30 LAB — CARDIOLIPIN ANTIBODIES, IGG, IGM, IGA
Anticardiolipin IgA: <11 [APL'U]
Anticardiolipin IgG: <14 [GPL'U]

## 2017-04-30 LAB — ANTI-SMITH ANTIBODY: ENA SM Ab Ser-aCnc: <1 AI

## 2017-04-30 LAB — VITAMIN D 25 HYDROXY (VIT D DEFICIENCY, FRACTURES): VIT D 25 HYDROXY: 36 ng/mL (ref 30–100)

## 2017-04-30 LAB — CK: Total CK: 110 U/L (ref 29–143)

## 2017-04-30 LAB — SJOGRENS SYNDROME-A EXTRACTABLE NUCLEAR ANTIBODY: SSA (Ro) (ENA) Antibody, IgG: <1 AI

## 2017-04-30 LAB — C3 AND C4
C3 COMPLEMENT: 124 mg/dL (ref 83–193)
C4 Complement: 24 mg/dL (ref 15–57)

## 2017-04-30 LAB — CYCLIC CITRUL PEPTIDE ANTIBODY, IGG

## 2017-04-30 LAB — TSH: TSH: 1.31 mIU/L

## 2017-04-30 LAB — RNP ANTIBODY: Ribonucleic Protein(ENA) Antibody, IgG: <1 AI

## 2017-04-30 NOTE — Progress Notes (Signed)
Will discuss at follow up visit

## 2017-05-21 NOTE — Progress Notes (Addendum)
Office Visit Note  Patient: [name]             Date of Birth: [birth year]           MRN: [id number]             PCP: Iona Hansen, NP Referring: Iona Hansen, NP Visit Date: 05/31/2017 Occupation: @GUAROCC @    Subjective:  Follow-up (Left knee pain, weakness, giving way)   History of Present Illness: [name] is a 41 y.o. female with history of some disc disease and positive ANA. She states she continues to have discomfort in her C-spine which radiates to her right shoulder. Approximately 3 weeks ago her left knee gave out on her. She denies any history of injury. She's been using a brace. She states she's very stiff in the morning and has disc ongoing discomfort in her left knee. She has not noticed any swelling.She had upper respiratory tract infection. She states last Monday she went to her PCP and her culture was positive for strep.and influenza A. She was treated with amoxicillin and Tamiflu. Her symptoms are better now.  Activities of Daily Living:  Patient reports morning stiffness for 40 minutes.   Patient Reports nocturnal pain.  Difficulty dressing/grooming: Reports Difficulty climbing stairs: Reports Difficulty getting out of chair: Denies Difficulty using hands for taps, buttons, cutlery, and/or writing: Denies   Review of Systems  Constitutional: Positive for fatigue. Negative for night sweats, weight gain, weight loss and weakness.  HENT: Positive for mouth dryness. Negative for mouth sores, trouble swallowing, trouble swallowing and nose dryness.   Eyes: Positive for itching and dryness. Negative for pain, redness and visual disturbance.  Respiratory: Negative for cough, shortness of breath and difficulty breathing.   Cardiovascular: Negative for chest pain, palpitations, hypertension, irregular heartbeat and swelling in legs/feet.  Gastrointestinal: Positive for constipation, heartburn and nausea. Negative for blood in stool and diarrhea.       H/o IBS  Endocrine: Negative for cold intolerance, excessive thirst and increased urination.  Genitourinary: Negative for hematuria and vaginal dryness.  Musculoskeletal: Positive for arthralgias, gait problem, joint pain, muscle weakness and morning stiffness. Negative for joint swelling, myalgias, muscle tenderness and myalgias.  Skin: Positive for rash. Negative for color change, hair loss, skin tightness, ulcers and sensitivity to sunlight.  Allergic/Immunologic: Negative for susceptible to infections.  Neurological: Negative for dizziness, light-headedness, numbness, headaches, memory loss and night sweats.  Hematological: Negative for bruising/bleeding tendency and swollen glands.  Psychiatric/Behavioral: Negative for depressed mood and sleep disturbance. The patient is not nervous/anxious.     PMFS History:  Patient Active Problem List   Diagnosis Date Noted  . History of IBS 04/27/2017  . Anxiety and depression 04/27/2017  . Stress headaches 04/27/2017  . Lactose intolerance 04/27/2017  . Gluten enteropathy 04/27/2017  . DEPRESSIVE DISORDER 03/14/2009  . TOE PAIN 03/14/2009  . HELLP SYNDROME 04/17/2008    Past Medical History:  Diagnosis Date  . GERD (gastroesophageal reflux disease)   . IBS (irritable bowel syndrome)   . PONV (postoperative nausea and vomiting)     History reviewed. No pertinent family history. Past Surgical History:  Procedure Laterality Date  . CESAREAN SECTION  2009  . WISDOM TOOTH EXTRACTION  2008   Social History   Social History Narrative  . Not on file     Objective: Vital Signs: BP (!) 100/57 (BP Location: Left Arm, Patient Position: Sitting, Cuff Size: Normal)   Pulse 74  Resp 14   Ht 5\' 2"  (1.575 m)   Wt 126 lb (57.2 kg)   BMI 23.05 kg/m    Physical Exam  Constitutional: She is oriented to person, place, and time. She appears well-developed and well-nourished.  HENT:  Head: Normocephalic and atraumatic.  Eyes:  Conjunctivae and EOM are normal.  Neck: Normal range of motion.  Cardiovascular: Normal rate, regular rhythm, normal heart sounds and intact distal pulses.  Pulmonary/Chest: Effort normal and breath sounds normal.  Abdominal: Soft. Bowel sounds are normal.  Lymphadenopathy:    She has no cervical adenopathy.  Neurological: She is alert and oriented to person, place, and time.  Skin: Skin is warm and dry. Capillary refill takes less than 2 seconds.  Psychiatric: She has a normal mood and affect. Her behavior is normal.  Nursing note and vitals reviewed.    Musculoskeletal Exam: C-spine some discomfort range of motion. Thoracic and lumbar spine good range of motion. Shoulder joints elbow joints wrist joint MCPs PIPs are good range of motion. No warmth swelling or effusion was noted. Hip joints knee joints ankles MTPs PIPs with good range of motion. She has discomfort with range of motion of her left knee joint. No effusion or warmth noted.  CDAI Exam: No CDAI exam completed.    Investigation: No additional findings. 04/27/2018 SPEP normal,CK 110, TSH normal, vitamin D 36 Anticardiolipin negative,lupus anticoagulant negative, beta 2 negative,C3-C4 normal, ENA negative except dsDNA 5 intermediate RF negative, anti-CCP negative, and ANCA negative  Imaging: Xr Knee 3 View Left  Result Date: 05/31/2017 No medial compartment narrowing was noted. No patellofemoral narrowing was noted. No chondrocalcinosis was noted. Impression: Knee x-ray was unremarkable.   Speciality Comments: No specialty comments available.    Procedures:  No procedures performed Allergies: Nickel   Assessment / Plan:     Visit Diagnoses: Positive ANA (antinuclear antibody) - ANA 1:160NS, DS 5, rest of the ENA negative, C3-C4 normal, anticardiolipin, beta-2 and lupus anticoagulant negative. Patient complains of arthralgias fatigue, sicca symptoms.I discussed possible use of Plaquenil. I've given her a handout on  Plaquenil to review in case she decides to proceed with it. She would like to hold off treatment for right now. I will check [facility] labs on her in 1 month. She had recent upper respiratory tract infection.  Chronic pain of left knee - Plan: XR KNEE 3 VIEW LEFT,x-ray was unremarkable. plan MRI of the left knee joint to rule out  Internal derangement.  Pain, neck: She has chronic pain due to underlying disc disease.  DDD (degenerative disc disease), cervical: Followed up by orthopedics.  Anxiety and depression  Gluten enteropathy  History of IBS - Followed up by Dr. Loreta AveMann  Stress headaches    Orders: Orders Placed This Encounter  Procedures  . XR KNEE 3 VIEW LEFT  . MR KNEE LEFT WO CONTRAST   No orders of the defined types were placed in this encounter.   Face-to-face time spent with patient was 30 minutes. Greater than 50% of time was spent in counseling and coordination of care.  Follow-Up Instructions: Return in about 3 months (around 08/31/2017) for Positive ANA.   Pollyann SavoyShaili Aluel Schwarz, MD  Note - This record has been created using Animal nutritionistDragon software.  Chart creation errors have been sought, but may not always  have been located. Such creation errors do not reflect on  the standard of medical care.

## 2017-05-31 ENCOUNTER — Ambulatory Visit: Payer: BLUE CROSS/BLUE SHIELD | Admitting: Rheumatology

## 2017-05-31 ENCOUNTER — Ambulatory Visit (INDEPENDENT_AMBULATORY_CARE_PROVIDER_SITE_OTHER): Payer: BLUE CROSS/BLUE SHIELD

## 2017-05-31 ENCOUNTER — Encounter: Payer: Self-pay | Admitting: Rheumatology

## 2017-05-31 VITALS — BP 100/57 | HR 74 | Resp 14 | Ht 62.0 in | Wt 126.0 lb

## 2017-05-31 DIAGNOSIS — M542 Cervicalgia: Secondary | ICD-10-CM | POA: Diagnosis not present

## 2017-05-31 DIAGNOSIS — G8929 Other chronic pain: Secondary | ICD-10-CM

## 2017-05-31 DIAGNOSIS — M5030 Other cervical disc degeneration, unspecified cervical region: Secondary | ICD-10-CM

## 2017-05-31 DIAGNOSIS — K9041 Non-celiac gluten sensitivity: Secondary | ICD-10-CM | POA: Diagnosis not present

## 2017-05-31 DIAGNOSIS — M25562 Pain in left knee: Secondary | ICD-10-CM

## 2017-05-31 DIAGNOSIS — R768 Other specified abnormal immunological findings in serum: Secondary | ICD-10-CM | POA: Diagnosis not present

## 2017-05-31 DIAGNOSIS — F419 Anxiety disorder, unspecified: Secondary | ICD-10-CM

## 2017-05-31 DIAGNOSIS — F329 Major depressive disorder, single episode, unspecified: Secondary | ICD-10-CM | POA: Diagnosis not present

## 2017-05-31 DIAGNOSIS — Z8719 Personal history of other diseases of the digestive system: Secondary | ICD-10-CM

## 2017-05-31 DIAGNOSIS — F4541 Pain disorder exclusively related to psychological factors: Secondary | ICD-10-CM | POA: Diagnosis not present

## 2017-05-31 DIAGNOSIS — F32A Depression, unspecified: Secondary | ICD-10-CM

## 2017-05-31 NOTE — Patient Instructions (Signed)

## 2017-06-04 ENCOUNTER — Ambulatory Visit (HOSPITAL_COMMUNITY): Admission: RE | Admit: 2017-06-04 | Payer: Self-pay | Source: Ambulatory Visit

## 2017-06-07 ENCOUNTER — Telehealth (INDEPENDENT_AMBULATORY_CARE_PROVIDER_SITE_OTHER): Payer: Self-pay

## 2017-06-07 NOTE — Telephone Encounter (Signed)
Patient would like to know if she could get a Rx/order for an assisting device to help her walk due to her left knee pain.  CB# is [phone].  Please advise.  Thank you.

## 2017-06-08 ENCOUNTER — Ambulatory Visit (HOSPITAL_COMMUNITY)
Admission: RE | Admit: 2017-06-08 | Discharge: 2017-06-08 | Disposition: A | Discharge status: Home / Self Care | Payer: BLUE CROSS/BLUE SHIELD | Source: Ambulatory Visit | Attending: Rheumatology | Admitting: Rheumatology

## 2017-06-08 DIAGNOSIS — G8929 Other chronic pain: Secondary | ICD-10-CM | POA: Diagnosis present

## 2017-06-08 DIAGNOSIS — R9389 Abnormal findings on diagnostic imaging of other specified body structures: Secondary | ICD-10-CM | POA: Insufficient documentation

## 2017-06-08 DIAGNOSIS — M25562 Pain in left knee: Secondary | ICD-10-CM | POA: Diagnosis present

## 2017-06-08 NOTE — Telephone Encounter (Signed)
Patient was calling again to see if she could be given an order or Rx for an assisting device.  CB# [phone].

## 2017-06-14 ENCOUNTER — Telehealth (INDEPENDENT_AMBULATORY_CARE_PROVIDER_SITE_OTHER): Payer: Self-pay

## 2017-06-14 MED ORDER — DICLOFENAC SODIUM 1 % TD GEL: TRANSDERMAL | 3 refills | Status: AC

## 2017-06-14 NOTE — Telephone Encounter (Signed)
Patient was returning Andrea's call.  Cb# is [phone].  Please advise.  Thank you.

## 2017-06-14 NOTE — Addendum Note (Signed)
Addended by: Henriette CombsHATTON, ANDREA L on: 06/14/2017 03:46 PM   Modules accepted: Orders

## 2017-06-14 NOTE — Telephone Encounter (Signed)
See previous phone note.  

## 2017-06-14 NOTE — Progress Notes (Signed)
Please notify patient that I am her MRI showed only contusion and no meniscal tear. She may benefit by taking either oral NSAIDs or Voltaren gel. She may use a knee brace and we can also refer her to physical therapy if she prefers.

## 2017-06-14 NOTE — Telephone Encounter (Signed)
Attempted to contact the patient and left message for patient to call the office.  

## 2017-06-14 NOTE — Telephone Encounter (Signed)
Patient is requesting a prescription for the assistive device called a knee scooter. Patient has been advised of MRI results and declines PT at this time and will call back if she changes her mind. Patient would like the prescription for Voltaren Gel that was offered. Prescription sent to the pharmacy.   Okay to give prescription for Knee Scooter?

## 2017-06-15 ENCOUNTER — Telehealth: Payer: Self-pay

## 2017-06-15 NOTE — Telephone Encounter (Signed)
Patient advised prescription for knee scooter is ready for pick up at the front.

## 2017-06-15 NOTE — Telephone Encounter (Signed)
[facility] my meds for Voltaren Gel. Authorization submitted. Will update once we receive a response.   Curtiss Mahmood, [place], CPhT 8:34 AM

## 2017-06-15 NOTE — Telephone Encounter (Signed)
ok 

## 2017-06-17 NOTE — Telephone Encounter (Signed)
[facility] a [facility] from [facility] regarding a prior authorization DENIAL for Voltaren Gel. Medication is only approved to treat osteoarthritis of the elbow, wrist, hand, knee, ankle or foot when the member has tried an oral anti-inflammatory medication that has not worked or is not tolerated.    Reference number: [id number] Phone number:[phone]  Will send document to scan center.  Called patient to update her. Informed her of the goodRx coupon. Patient is coming by tomorrow to pick up her Rx for a knee scooter. She will pick up a Good[facility] card as well [facility]. Patient voices understanding and denies any questions at this time.   Shanelle Clontz, [place], CPhT 2:34 PM

## 2017-08-24 ENCOUNTER — Telehealth: Payer: Self-pay | Admitting: Rheumatology

## 2017-08-24 NOTE — Telephone Encounter (Signed)
Patient left a voicemail stating that she was told to get her bloodwork for the Lupus testing done at a different location.  She states that she was given paperwork with the name and location of the lab but she has misplaced it.  She requests a return call with the information.

## 2017-08-24 NOTE — Telephone Encounter (Signed)
Called patient and gave patient lab name, address and phone number. Order has been re-faxed to the lab as well for the [facility]. Also advised patient she could go online to obtain a coupon to use.

## 2017-08-27 NOTE — Progress Notes (Deleted)
   Office Visit Note  Patient: [name]             Date of Birth: [birth year]           MRN: [id number]             PCP: Iona HansenJones, Penny L, NP Referring: Iona HansenJones, Penny L, NP Visit Date: 09/09/2017 Occupation: @GUAROCC @    Subjective:  No chief complaint on file.   History of Present Illness: Elizabeth FateJennifer L Halbleib is a 42 y.o. female ***   Activities of Daily Living:  Patient reports morning stiffness for *** {minute/hour:19697}.   Patient {ACTIONS;DENIES/REPORTS:21021675::"Denies"} nocturnal pain.  Difficulty dressing/grooming: {ACTIONS;DENIES/REPORTS:21021675::"Denies"} Difficulty climbing stairs: {ACTIONS;DENIES/REPORTS:21021675::"Denies"} Difficulty getting out of chair: {ACTIONS;DENIES/REPORTS:21021675::"Denies"} Difficulty using hands for taps, buttons, cutlery, and/or writing: {ACTIONS;DENIES/REPORTS:21021675::"Denies"}   No Rheumatology ROS completed.   PMFS History:  Patient Active Problem List   Diagnosis Date Noted  . History of IBS 04/27/2017  . Anxiety and depression 04/27/2017  . Stress headaches 04/27/2017  . Lactose intolerance 04/27/2017  . Gluten enteropathy 04/27/2017  . DEPRESSIVE DISORDER 03/14/2009  . TOE PAIN 03/14/2009  . HELLP SYNDROME 04/17/2008    Past Medical History:  Diagnosis Date  . GERD (gastroesophageal reflux disease)   . IBS (irritable bowel syndrome)   . PONV (postoperative nausea and vomiting)     No family history on file. Past Surgical History:  Procedure Laterality Date  . CESAREAN SECTION  2009  . CESAREAN SECTION N/A 08/27/2012   Procedure: Primary  cesarean section with delivery of baby girl at [address]0826. Apgars 8/9.;  Surgeon: Jeani HawkingMichelle L Grewal, MD;  Location: [facility];  Service: Obstetrics;  Laterality: N/A;  . WISDOM TOOTH EXTRACTION  2008   Social History   Social History Narrative  . Not on file     Objective: Vital Signs: There were no vitals taken for this visit.   Physical Exam   Musculoskeletal Exam:  ***  CDAI Exam: No CDAI exam completed.    Investigation: No additional findings.   Imaging: No results found.  Speciality Comments: No specialty comments available.    Procedures:  No procedures performed Allergies: Nickel   Assessment / Plan:     Visit Diagnoses: No diagnosis found.    Orders: No orders of the defined types were placed in this encounter.  No orders of the defined types were placed in this encounter.   Face-to-face time spent with patient was *** minutes. 50% of time was spent in counseling and coordination of care.  Follow-Up Instructions: No Follow-up on file.   Ellen HenriMarissa C Sireen Halk, CMA  Note - This record has been created using Animal nutritionistDragon software.  Chart creation errors have been sought, but may not always  have been located. Such creation errors do not reflect on  the standard of medical care.

## 2017-08-31 NOTE — Telephone Encounter (Signed)
Patient advised orders have been re-faxed.

## 2017-08-31 NOTE — Telephone Encounter (Signed)
Patient called stating that she called [facility] in [place] to set up an appointment to have her labwork done and they told her they don't have an order on file.  Patient is requesting an order be faxed to their office [phone]

## 2017-09-02 NOTE — Progress Notes (Addendum)
Office Visit Note  Patient: Elizabeth FateJennifer L Cogburn             Date of Birth: 05-17-1976           MRN: [id number]             PCP: Iona HansenJones, Penny L, NP Referring: Iona HansenJones, Penny L, NP Visit Date: 09/09/2017 Occupation: @GUAROCC @    Subjective:  Neck pain    History of Present Illness: [name] is a 42 y.o. female with history of positive DNA and DDD c-spine.  Patient states that she is having increased pain in her neck and radiation into her right shoulder.  She states that a MRI in summer 2019 revealed a "pinched nerve."  She has been seeing a chiropractor twice a week since August.  She has also had neck massages and uses heat therapy.  She states that she is going to try using bone broth powder more frequently.   She denies any oral or nasal ulcers.  She denies any swollen lymph nodes.  She has increased fatigue and anxiety lately.  She is no longer on any medications for her anxiety.  She had a panic attack last week.  She states that she will periodically have hives that form on her face, neck, and arms.  She was unable to identify a trigger.  She states that the baker's cyst in her left knee is not as large and has not been causing as much limitation in motion.  She has stopped running due to worrying about the ROM and risk of falling.    Activities of Daily Living:  Patient reports morning stiffness for 5  minutes.   Patient Reports nocturnal pain.  Difficulty dressing/grooming: Denies Difficulty climbing stairs: Denies Difficulty getting out of chair: Denies Difficulty using hands for taps, buttons, cutlery, and/or writing: Reports   Review of Systems  Constitutional: Positive for fatigue. Negative for weakness.  HENT: Negative for mouth sores, mouth dryness and nose dryness.   Eyes: Positive for dryness. Negative for pain, redness and visual disturbance.  Respiratory: Positive for cough. Negative for hemoptysis, shortness of breath and difficulty breathing.     Cardiovascular: Negative for chest pain, palpitations, hypertension and swelling in legs/feet.  Gastrointestinal: Positive for constipation and diarrhea. Negative for blood in stool.  Endocrine: Negative for increased urination.  Genitourinary: Negative for painful urination.  Musculoskeletal: Positive for arthralgias, joint pain and morning stiffness. Negative for joint swelling, myalgias, muscle weakness, muscle tenderness and myalgias.  Skin: Positive for rash and sensitivity to sunlight. Negative for color change, pallor, hair loss, nodules/bumps, redness, skin tightness and ulcers.  Neurological: Negative for dizziness, numbness and headaches.  Hematological: Negative for swollen glands.  Psychiatric/Behavioral: Negative for depressed mood and sleep disturbance. The patient is nervous/anxious.     PMFS History:  Patient Active Problem List   Diagnosis Date Noted  . History of IBS 04/27/2017  . Anxiety and depression 04/27/2017  . Stress headaches 04/27/2017  . Lactose intolerance 04/27/2017  . Gluten enteropathy 04/27/2017  . DEPRESSIVE DISORDER 03/14/2009  . TOE PAIN 03/14/2009  . HELLP SYNDROME 04/17/2008    Past Medical History:  Diagnosis Date  . GERD (gastroesophageal reflux disease)   . IBS (irritable bowel syndrome)   . PONV (postoperative nausea and vomiting)     History reviewed. No pertinent family history. Past Surgical History:  Procedure Laterality Date  . CESAREAN SECTION  2009  . CESAREAN SECTION N/A 08/27/2012   Procedure: Primary  cesarean  section with delivery of baby girl at 4. Apgars 8/9.;  Surgeon: Jeani Hawking, MD;  Location: [facility];  Service: Obstetrics;  Laterality: N/A;  . WISDOM TOOTH EXTRACTION  2008   Social History   Social History Narrative  . Not on file     Objective: Vital Signs: BP 111/73 (BP Location: Left Arm, Patient Position: Sitting, Cuff Size: Normal)   Pulse 76   Resp 15   Ht 5\' 2"  (1.575 m)   Wt 126 lb (57.2 kg)    BMI 23.05 kg/m    Physical Exam  Constitutional: She is oriented to person, place, and time. She appears well-developed and well-nourished.  HENT:  Head: Normocephalic and atraumatic.  Eyes: Conjunctivae and EOM are normal.  Neck: Normal range of motion.  Cardiovascular: Normal rate, regular rhythm, normal heart sounds and intact distal pulses.  Pulmonary/Chest: Effort normal and breath sounds normal.  Abdominal: Soft. Bowel sounds are normal.  Lymphadenopathy:    She has no cervical adenopathy.  Neurological: She is alert and oriented to person, place, and time.  Skin: Skin is warm and dry. Capillary refill takes less than 2 seconds.  Psychiatric: She has a normal mood and affect. Her behavior is normal.  Nursing note and vitals reviewed.    Musculoskeletal Exam: C-spine limited ROM with discomfort.  Thoracic and lumbar spine good ROM with no spinal tenderness.  No SI joint tenderness.  Right shoulder good ROM with discomfort.  Left shoulder good ROM.  Elbow joints, wrist joints, MCPs, PIPs, and DIPs good ROM with no synovitis.  Hip joints, knee joints, ankle joints, MTPs, PIPs, and DIPs good ROM with no synovitis.  She has some tenderness of her right trochanteric bursa.  No warmth or effusion of knees.  No knee crepitus.    CDAI Exam: No CDAI exam completed.    Investigation: No additional findings. CBC Latest Ref Rng & Units 08/29/2012 08/28/2012 08/27/2012  WBC 4.0 - 10.5 K/uL 10.3 11.8(H) 10.6(H)  Hemoglobin 12.0 - 15.0 g/dL 10.2(L) 10.4(L) 12.3  Hematocrit 36.0 - 46.0 % 30.6(L) 30.7(L) 35.9(L)  Platelets 150 - 400 K/uL 113(L) 104(L) 115(L)   CMP Latest Ref Rng & Units 04/27/2017 06/16/2006  Glucose 70 - 99 mg/dL - 85  BUN 6 - 23 mg/dL - 7  Creatinine 0.4 - 1.2 mg/dL - 0.9  Sodium 161 - 096 meq/L - 140  Potassium 3.5 - 5.1 meq/L - 4.0  Chloride 96 - 112 meq/L - 106  CO2 19 - 32 meq/L - 29  Calcium 8.4 - 10.5 mg/dL - 9.6  Total Protein 6.1 - 8.1 g/dL 7.7 7.5  Total  Bilirubin 0.3 - 1.2 mg/dL - 0.9  Alkaline Phos 39 - 117 units/L - 44  AST 0 - 37 units/L - 26  ALT 0 - 40 units/L - 25    Imaging: No results found.  Speciality Comments: No specialty comments available.    Procedures:  No procedures performed Allergies: Nickel   Assessment / Plan:     Visit Diagnoses: Positive ANA (antinuclear antibody) - 1:160 NS.  She had [facility] labs performed last week.  The results are not back yet.  Dr. Corliss Skains will call the patient to discuss the results.  Plaquenil was discussed at her last vitis.  She continues to have fatigue, hives, and joint stiffness.  No oral ulcers, nasal ulcers, or lymphadenopathy on exam.   DDD (degenerative disc disease), cervical: She has limitation in motion.  She has been seeing a [facility]  twice a week since August 2018, which has been helping.  She is having radiation of pain and occasional numbness in her fingers.  She has a MRI performed summer 2018.  Stress headaches  Anxiety and depression: She continues to have anxiety.  She is no longer on any medications to manage her anxiety.  We discussed her following up with her PCP.  She had a panic attack last week for the first time in a while.   Gluten enteropathy: She sees Dr. Loreta Ave.   History of IBS - She is followed by Dr. Loreta Ave. She continues to have constipation and diarrhea.  She states that since starting probiotics she feels a little better.  Discussed trying to introduce fiber as well.   Hives: She gets hives on her face, neck, and arms periodically.  She cannot identify a trigger of the hives. I advised her to take zyrtec during the day if she continues to have frequent hives.  She can also take benadryl at night.     Trochanteric bursitis, right hip: She has tenderness of her right trochanteric bursa.  She sleeps on her side at night.  She was given a handout for exercises she can perform at home.  We discussed if her pain worsens that she can return for a cortisone  injection or attend physical therapy.     Orders: No orders of the defined types were placed in this encounter.  No orders of the defined types were placed in this encounter.  Face-to-face time spent with patient was 30 minutes. Greater than 50% of time was spent in counseling and coordination of care.  Follow-Up Instructions: No Follow-up on file.   Gearldine Bienenstock, PA-C   I examined and evaluated the patient with Sherron Ales PA. The plan of care was discussed as noted above.  Pollyann Savoy, MD  Note - This record has been created using Animal nutritionist.  Chart creation errors have been sought, but may not always  have been located. Such creation errors do not reflect on  the standard of medical care.

## 2017-09-09 ENCOUNTER — Ambulatory Visit: Payer: BLUE CROSS/BLUE SHIELD | Admitting: Rheumatology

## 2017-09-09 ENCOUNTER — Encounter: Payer: Self-pay | Admitting: Physician Assistant

## 2017-09-09 VITALS — BP 111/73 | HR 76 | Resp 15 | Ht 62.0 in | Wt 126.0 lb

## 2017-09-09 DIAGNOSIS — M5030 Other cervical disc degeneration, unspecified cervical region: Secondary | ICD-10-CM

## 2017-09-09 DIAGNOSIS — L509 Urticaria, unspecified: Secondary | ICD-10-CM

## 2017-09-09 DIAGNOSIS — Z8719 Personal history of other diseases of the digestive system: Secondary | ICD-10-CM

## 2017-09-09 DIAGNOSIS — F329 Major depressive disorder, single episode, unspecified: Secondary | ICD-10-CM

## 2017-09-09 DIAGNOSIS — R768 Other specified abnormal immunological findings in serum: Secondary | ICD-10-CM

## 2017-09-09 DIAGNOSIS — M7061 Trochanteric bursitis, right hip: Secondary | ICD-10-CM | POA: Diagnosis not present

## 2017-09-09 DIAGNOSIS — F4541 Pain disorder exclusively related to psychological factors: Secondary | ICD-10-CM | POA: Diagnosis not present

## 2017-09-09 DIAGNOSIS — K9041 Non-celiac gluten sensitivity: Secondary | ICD-10-CM | POA: Diagnosis not present

## 2017-09-09 DIAGNOSIS — F419 Anxiety disorder, unspecified: Secondary | ICD-10-CM

## 2017-09-09 NOTE — Patient Instructions (Signed)
Natural anti-inflammatories  You can purchase these at [employer], [employer] or online.  . Turmeric (capsules)  . Ginger (ginger root or capsules)  . Omega 3 (Fish, flax seeds, chia seeds, walnuts, almonds)  . Tart cherry (dried or extract)   Patient should be under the care of a physician while taking these supplements. This may not be reproduced without the permission of Dr. Pollyann SavoyShaili Deveshwar.  Trochanteric Bursitis Rehab Ask your health care provider which exercises are safe for you. Do exercises exactly as told by your health care provider and adjust them as directed. It is normal to feel mild stretching, pulling, tightness, or discomfort as you do these exercises, but you should stop right away if you feel sudden pain or your pain gets worse.Do not begin these exercises until told by your health care provider. Stretching exercises These exercises warm up your muscles and joints and improve the movement and flexibility of your hip. These exercises also help to relieve pain and stiffness. Exercise A: Iliotibial band stretch  1. Lie on your side with your left / right leg in the top position. 2. Bend your left / right knee and grab your ankle. 3. Slowly bring your knee back so your thigh is behind your body. 4. Slowly lower your knee toward the floor until you feel a gentle stretch on the outside of your left / right thigh. If you do not feel a stretch and your knee will not fall farther, place the heel of your other foot on top of your outer knee and pull your thigh down farther. 5. Hold this position for __________ seconds. 6. Slowly return to the starting position. Repeat __________ times. Complete this exercise __________ times a day. Strengthening exercises These exercises build strength and endurance in your hip and pelvis. Endurance is the ability to use your muscles for a long time, even after they get tired. Exercise B: Bridge ( hip extensors) 1. Lie on your back on a  firm surface with your knees bent and your feet flat on the floor. 2. Tighten your buttocks muscles and lift your buttocks off the floor until your trunk is level with your thighs. You should feel the muscles working in your buttocks and the back of your thighs. If this exercise is too easy, try doing it with your arms crossed over your chest. 3. Hold this position for __________ seconds. 4. Slowly return to the starting position. 5. Let your muscles relax completely between repetitions. Repeat __________ times. Complete this exercise __________ times a day. Exercise C: Squats ( knee extensors and  quadriceps) 1. Stand in front of a table, with your feet and knees pointing straight ahead. You may rest your hands on the table for balance but not for support. 2. Slowly bend your knees and lower your hips like you are going to sit in a chair. ? Keep your weight over your heels, not over your toes. ? Keep your lower legs upright so they are parallel with the table legs. ? Do not let your hips go lower than your knees. ? Do not bend lower than told by your health care provider. ? If your hip pain increases, do not bend as low. 3. Hold this position for __________ seconds. 4. Slowly push with your legs to return to standing. Do not use your hands to pull yourself to standing. Repeat __________ times. Complete this exercise __________ times a day. Exercise D: Hip hike 1. Stand sideways on a bottom step. Stand on  your left / right leg with your other foot unsupported next to the step. You can hold onto the railing or wall if needed for balance. 2. Keeping your knees straight and your torso square, lift your left / right hip up toward the ceiling. 3. Hold this position for __________ seconds. 4. Slowly let your left / right hip lower toward the floor, past the starting position. Your foot should get closer to the floor. Do not lean or bend your knees. Repeat __________ times. Complete this exercise  __________ times a day. Exercise E: Single leg stand 1. Stand near a counter or door frame that you can hold onto for balance as needed. It is helpful to stand in front of a mirror for this exercise so you can watch your hip. 2. Squeeze your left / right buttock muscles then lift up your other foot. Do not let your left / right hip push out to the side. 3. Hold this position for __________ seconds. Repeat __________ times. Complete this exercise __________ times a day. This information is not intended to replace advice given to you by your health care provider. Make sure you discuss any questions you have with your health care provider. Document Released: 08/13/2004 Document Revised: 03/12/2016 Document Reviewed: 06/21/2015 Elsevier Interactive Patient Education  [employer].

## 2017-12-21 ENCOUNTER — Other Ambulatory Visit: Payer: Self-pay | Admitting: Gastroenterology

## 2017-12-21 DIAGNOSIS — R140 Abdominal distension (gaseous): Secondary | ICD-10-CM

## 2017-12-28 ENCOUNTER — Ambulatory Visit
Admission: RE | Admit: 2017-12-28 | Discharge: 2017-12-28 | Disposition: A | Discharge status: Home / Self Care | Payer: BLUE CROSS/BLUE SHIELD | Source: Ambulatory Visit | Attending: Gastroenterology | Admitting: Gastroenterology

## 2017-12-28 DIAGNOSIS — R140 Abdominal distension (gaseous): Secondary | ICD-10-CM

## 2018-01-27 NOTE — Progress Notes (Deleted)
   Office Visit Note  Patient: [name]             Date of Birth: [birth year]           MRN: [id number]             PCP: Iona HansenJones, Penny L, NP Referring: Iona HansenJones, Penny L, NP Visit Date: 02/10/2018 Occupation: @GUAROCC @  Subjective:  No chief complaint on file.   History of Present Illness: [name] is a 42 y.o. female ***   Activities of Daily Living:  Patient reports morning stiffness for *** {minute/hour:19697}.   Patient {ACTIONS;DENIES/REPORTS:21021675::"Denies"} nocturnal pain.  Difficulty dressing/grooming: {ACTIONS;DENIES/REPORTS:21021675::"Denies"} Difficulty climbing stairs: {ACTIONS;DENIES/REPORTS:21021675::"Denies"} Difficulty getting out of chair: {ACTIONS;DENIES/REPORTS:21021675::"Denies"} Difficulty using hands for taps, buttons, cutlery, and/or writing: {ACTIONS;DENIES/REPORTS:21021675::"Denies"}  No Rheumatology ROS completed.   PMFS History:  Patient Active Problem List   Diagnosis Date Noted  . History of IBS 04/27/2017  . Anxiety and depression 04/27/2017  . Stress headaches 04/27/2017  . Lactose intolerance 04/27/2017  . Gluten enteropathy 04/27/2017  . DEPRESSIVE DISORDER 03/14/2009  . TOE PAIN 03/14/2009  . HELLP SYNDROME 04/17/2008    Past Medical History:  Diagnosis Date  . GERD (gastroesophageal reflux disease)   . IBS (irritable bowel syndrome)   . PONV (postoperative nausea and vomiting)     No family history on file. Past Surgical History:  Procedure Laterality Date  . CESAREAN SECTION  2009  . CESAREAN SECTION N/A 08/27/2012   Procedure: Primary  cesarean section with delivery of baby girl at [address]26. Apgars 8/9.;  Surgeon: Jeani HawkingMichelle L Grewal, MD;  Location: [facility];  Service: Obstetrics;  Laterality: N/A;  . WISDOM TOOTH EXTRACTION  2008   Social History   Social History Narrative  . Not on file    Objective: Vital Signs: There were no vitals taken for this visit.   Physical Exam   Musculoskeletal Exam: ***  CDAI  Exam: No CDAI exam completed.   Investigation: No additional findings.  Imaging: No results found.  Recent Labs: Lab Results  Component Value Date   WBC 10.3 08/29/2012   HGB 10.2 (L) 08/29/2012   PLT 113 (L) 08/29/2012   NA 140 06/16/2006   K 4.0 06/16/2006   CL 106 06/16/2006   CO2 29 06/16/2006   GLUCOSE 85 06/16/2006   BUN 7 06/16/2006   CREATININE 0.9 06/16/2006   BILITOT 0.9 06/16/2006   ALKPHOS 44 06/16/2006   AST 26 06/16/2006   ALT 25 06/16/2006   PROT 7.7 04/27/2017   ALBUMIN 4.5 06/16/2006   CALCIUM 9.6 06/16/2006    Speciality Comments: No specialty comments available.  Procedures:  No procedures performed Allergies: Nickel   Assessment / Plan:     Visit Diagnoses: No diagnosis found.   Orders: No orders of the defined types were placed in this encounter.  No orders of the defined types were placed in this encounter.   Face-to-face time spent with patient was *** minutes. Greater than 50% of time was spent in counseling and coordination of care.  Follow-Up Instructions: No follow-ups on file.   Ellen HenriMarissa C Xeng Kucher, CMA  Note - This record has been created using Animal nutritionistDragon software.  Chart creation errors have been sought, but may not always  have been located. Such creation errors do not reflect on  the standard of medical care.

## 2018-02-10 ENCOUNTER — Ambulatory Visit: Payer: BLUE CROSS/BLUE SHIELD | Admitting: Physician Assistant

## 2019-01-11 IMAGING — US US PELVIS COMPLETE TRANSABD/TRANSVAG
1 series · 14 of 25 positions shown · non-contrast
Comparison: None

CLINICAL DATA: Abdominal and pelvic gas and bloating. History of
ovarian cysts.

EXAM:
TRANSABDOMINAL AND TRANSVAGINAL ULTRASOUND OF PELVIS
TECHNIQUE: Both transabdominal and transvaginal ultrasound examinations of the
pelvis were performed. Transabdominal technique was performed for
global imaging of the pelvis including uterus, ovaries, adnexal
regions, and pelvic cul-de-sac. It was necessary to proceed with
endovaginal exam following the transabdominal exam to visualize the
endometrium and ovaries.

[Series 1: us pelvis complete transabd/transvag · 0.28mm/px · 14 of 79 slices shown]
[im 1/79]
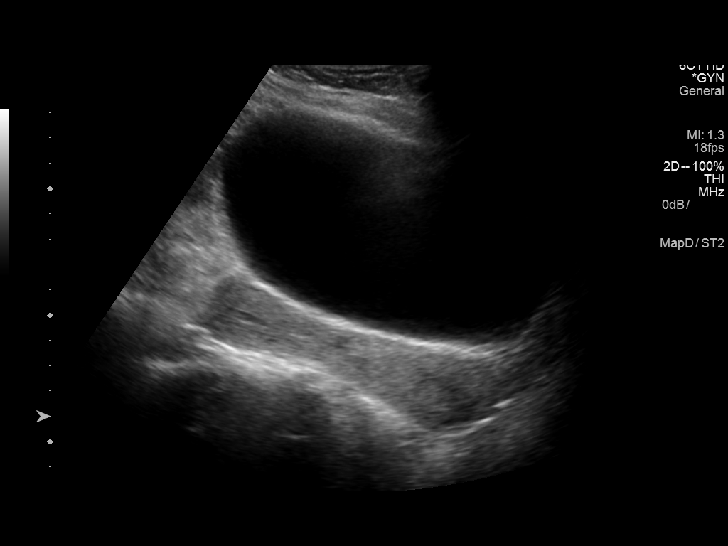
[im 7/79]
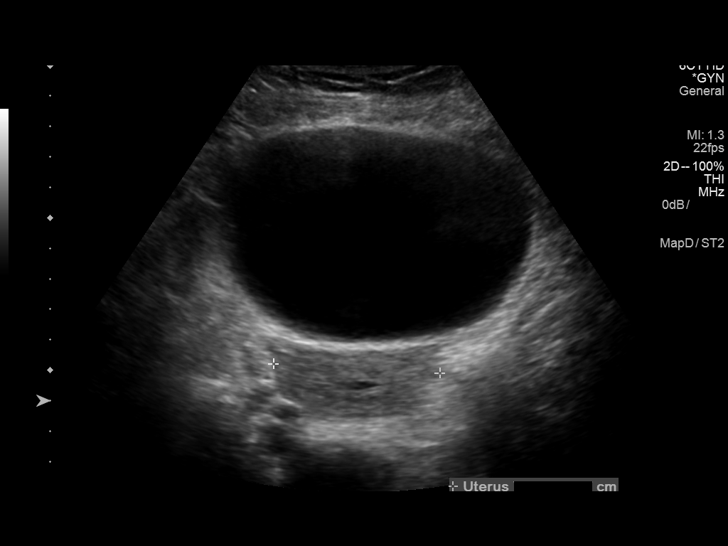
[im 14/79]
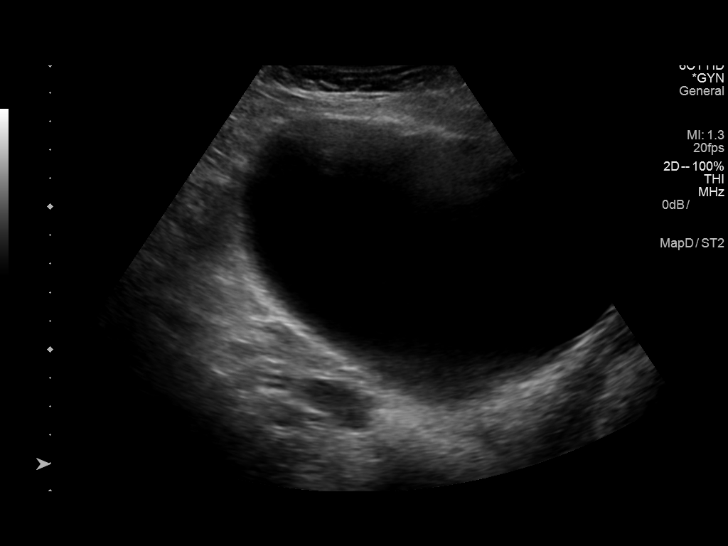
[im 20/79]
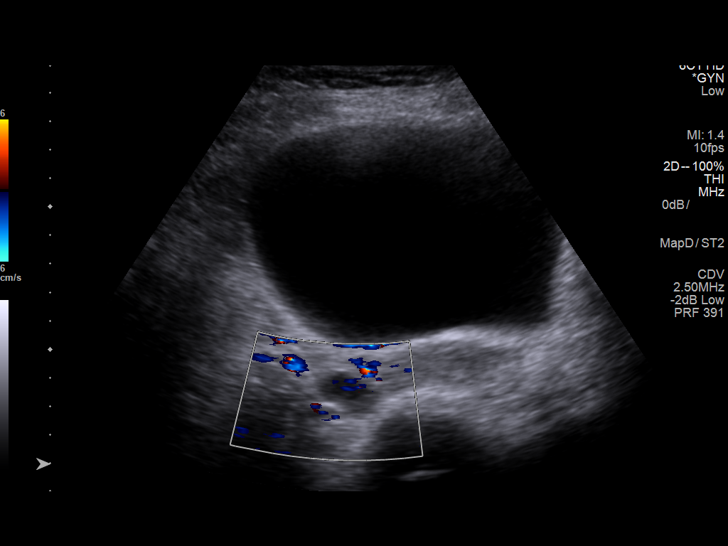
[im 27/79]
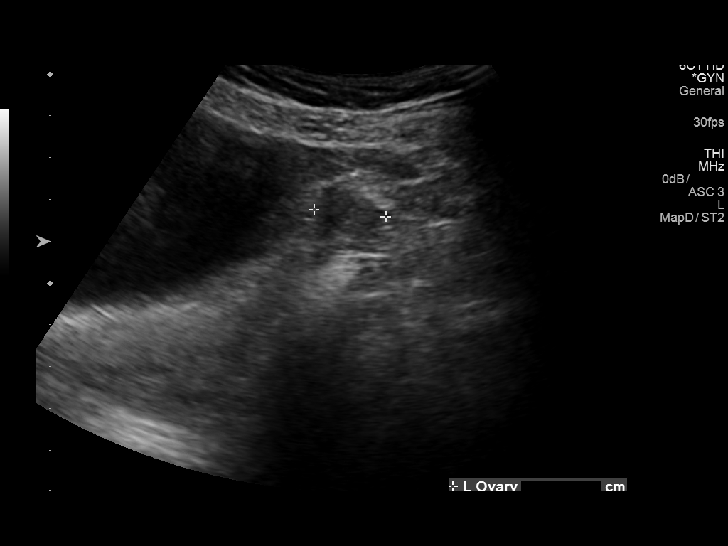
[im 30/79]
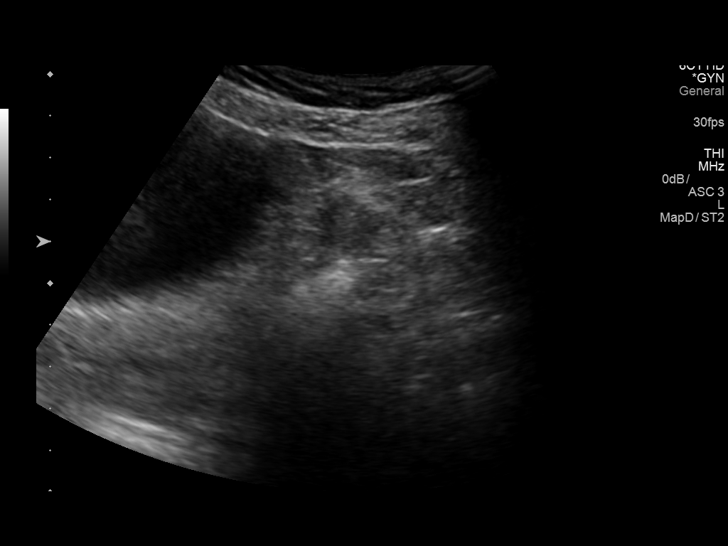
[im 36/79]
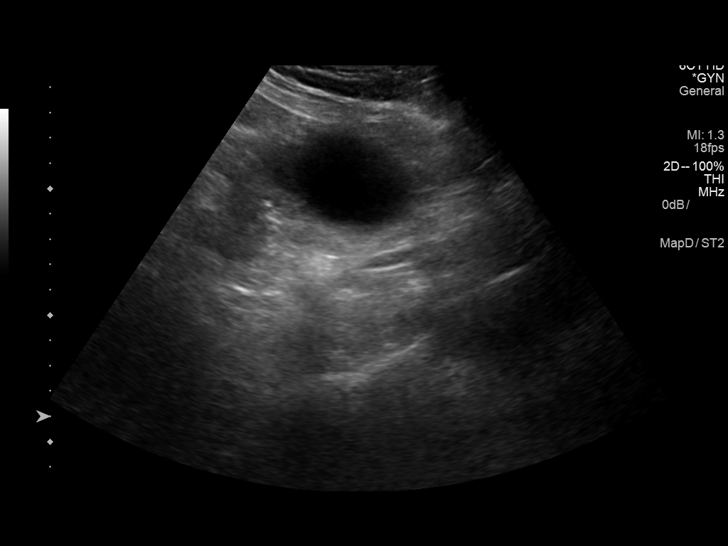
[im 43/79]
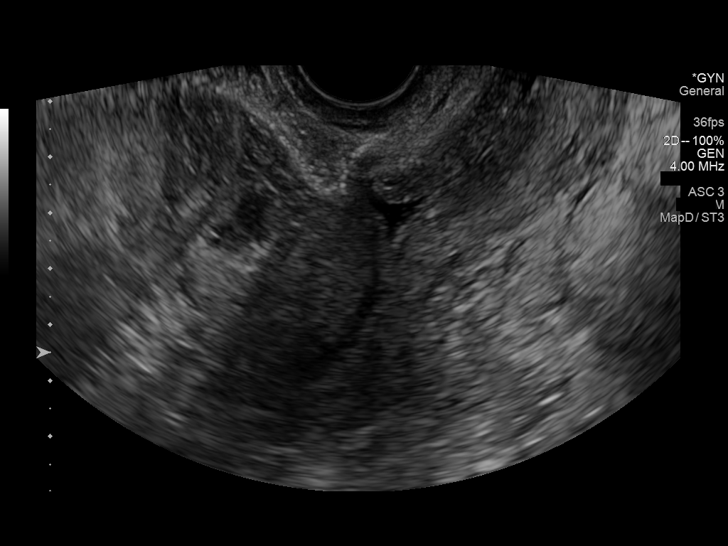
[im 49/79]
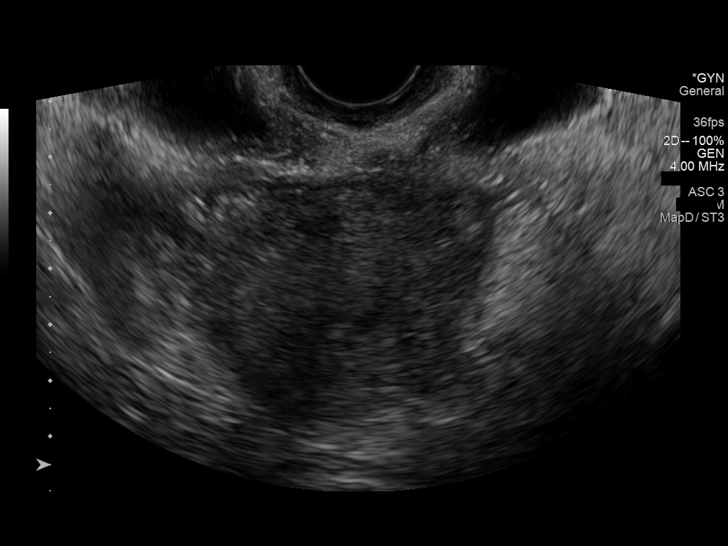
[im 53/79]
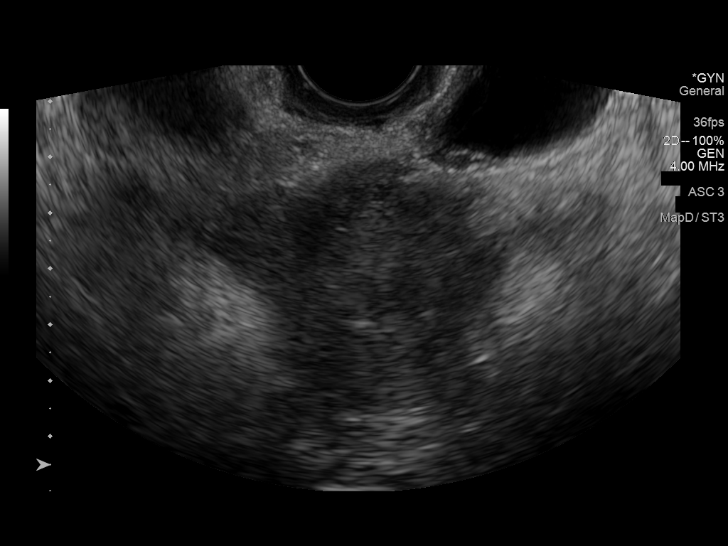
[im 59/79]
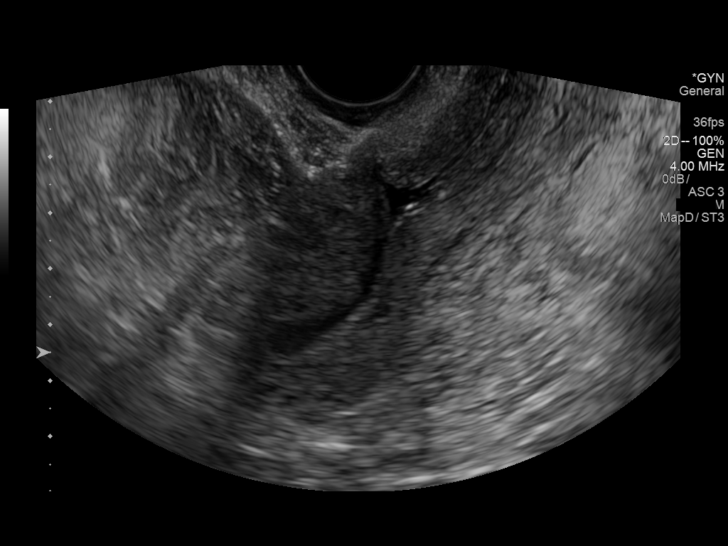
[im 66/79]
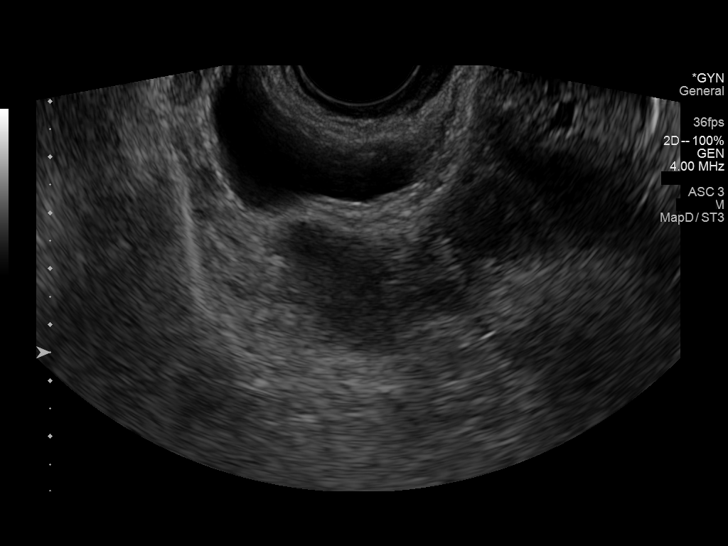
[im 72/79]
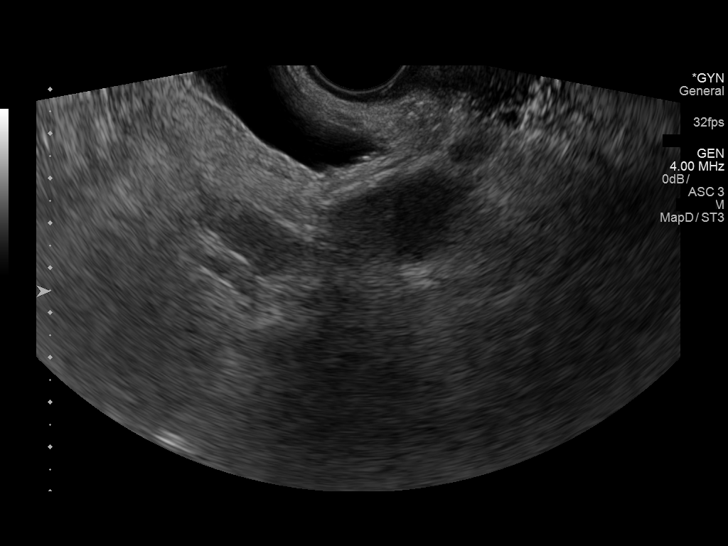
[im 79/79]
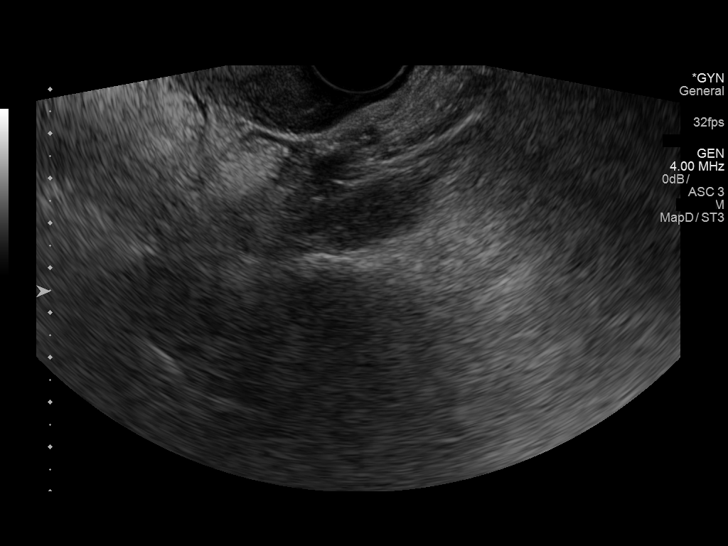

[14 of 25 positions shown; findings below may reference images not displayed]

FINDINGS: Uterus

Measurements: 9.1 x 4.0 x 4.8 cm. No fibroids or other mass
visualized. Prior C-section scar noted.

Endometrium

Thickness: 5 mm. Tiny amount of fluid is noted within the
endometrial cavity, however no focal abnormality identified.

Right ovary

Measurements: 3.2 x 2.0 x 2.1 cm. Normal appearance/no adnexal mass.

Left ovary

Measurements: 2.0 x 2.2 x 1.7 cm. Normal appearance/no adnexal mass.

Other findings

No abnormal free fluid.
IMPRESSION: No pelvic mass or other significant abnormality identified.
